# Patient Record
Sex: Female | Born: 1990
Health system: Southern US, Community
[De-identification: ages and names within clinical notes are randomized; demographics above are authoritative.]

## PROBLEM LIST (undated history)

## (undated) DIAGNOSIS — Z789 Other specified health status: Secondary | ICD-10-CM

## (undated) HISTORY — PX: REFRACTIVE SURGERY: SHX103

## (undated) HISTORY — PX: EYE SURGERY: SHX253

---
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
## 2009-01-15 ENCOUNTER — Encounter: Payer: Self-pay | Admitting: Internal Medicine

## 2009-01-18 ENCOUNTER — Encounter: Payer: Self-pay | Admitting: Internal Medicine

## 2009-03-10 ENCOUNTER — Ambulatory Visit: Payer: Self-pay | Admitting: Internal Medicine

## 2009-03-10 DIAGNOSIS — R634 Abnormal weight loss: Secondary | ICD-10-CM | POA: Insufficient documentation

## 2009-03-10 DIAGNOSIS — R197 Diarrhea, unspecified: Secondary | ICD-10-CM | POA: Insufficient documentation

## 2009-03-10 LAB — CONVERTED CEMR LAB: IgA: 197 mg/dL (ref 68–378)

## 2009-03-12 LAB — CONVERTED CEMR LAB: Tissue Transglutaminase Ab, IgA: 0.1 units (ref <7)

## 2015-08-02 ENCOUNTER — Ambulatory Visit: Payer: Self-pay | Admitting: Physician Assistant

## 2015-08-02 ENCOUNTER — Encounter: Payer: Self-pay | Admitting: Physician Assistant

## 2015-08-02 VITALS — BP 120/80 | Temp 98.4°F | Wt 125.0 lb

## 2015-08-02 DIAGNOSIS — L301 Dyshidrosis [pompholyx]: Secondary | ICD-10-CM

## 2015-08-02 NOTE — Progress Notes (Signed)
  Rash on finger Subjective:    Patient ID: Renee Hart, female    DOB: Oct 15, 1991, 24 y.o.   MRN: [id number]  HPI Patient c/o rash on 2nd digit right hand. Patient states contact with cleaning chemicals at work without using gloves.  States mild itching. Condition is improving with 1% hydrocortisone.    Review of Systems Diarrhea     Objective:   Physical Exam Papular lesion on non-erythematous 2nd digit right hand.       Assessment & Plan:  Dyshidrotic eczema.  Advised to continue using Hydrocortisone ointment.  Follow up if condition worsen.

## 2016-05-19 ENCOUNTER — Ambulatory Visit: Payer: Self-pay | Admitting: Family

## 2016-05-19 VITALS — BP 110/70 | HR 117 | Temp 98.0°F

## 2016-05-19 DIAGNOSIS — H109 Unspecified conjunctivitis: Secondary | ICD-10-CM

## 2016-05-22 MED ORDER — CIPROFLOXACIN HCL 0.3 % OP SOLN: 2.0000 [drp] | Freq: Four times a day (QID) | OPHTHALMIC | Status: AC

## 2016-05-22 NOTE — Progress Notes (Signed)
S/ eyes are very red, irritated, left eye watering slight light sensitivity,some mattering in the am, hx of allergies but no acute sxs , and lasik surgery .She googled and thought she might have a blocked tear duct and has been massaging her eye. Also concerned that she has been going to tanning bed that may have been contraindicated with lasik  O pleasant NAD  PERRLA, EOMS full, both conjunctiva are injected, no obvious inner canthus swelling or redness ,nasal mucosa swollen, sinuses nontender , throat clear  A/ conjunctivitis P/ ciprfloxin opthalmic 0.3 % 1-2 gtts each eye qid x 5 days  F/u with eye dr if sxs not improving.

## 2016-10-04 ENCOUNTER — Encounter: Payer: Self-pay | Admitting: Physician Assistant

## 2016-10-04 ENCOUNTER — Ambulatory Visit: Payer: Self-pay | Admitting: Physician Assistant

## 2016-10-04 VITALS — BP 120/80 | HR 80 | Temp 98.3°F

## 2016-10-04 DIAGNOSIS — J00 Acute nasopharyngitis [common cold]: Secondary | ICD-10-CM

## 2016-10-04 NOTE — Progress Notes (Signed)
S: C/o sore throat and dry cough 2 days, no fever, chills, cp/sob, v/d; mucus was green this am but clear throughout the day, cough is sporadic,   Using otc meds:   O: PE: perrl eomi, normocephalic, tms dull, nasal mucosa red and swollen, throat injected, neck supple no lymph, lungs c t a, cv rrr, neuro intact  A:  Acute viral uri   P: drink fluids, continue regular meds , use otc meds of choice, return if not improving in 5 days, return earlier if worsening ,reassurance,

## 2017-11-27 DIAGNOSIS — D2239 Melanocytic nevi of other parts of face: Secondary | ICD-10-CM | POA: Diagnosis not present

## 2017-11-27 DIAGNOSIS — L700 Acne vulgaris: Secondary | ICD-10-CM | POA: Diagnosis not present

## 2017-11-27 DIAGNOSIS — D485 Neoplasm of uncertain behavior of skin: Secondary | ICD-10-CM | POA: Diagnosis not present

## 2018-02-12 DIAGNOSIS — D2239 Melanocytic nevi of other parts of face: Secondary | ICD-10-CM | POA: Diagnosis not present

## 2018-02-12 DIAGNOSIS — L700 Acne vulgaris: Secondary | ICD-10-CM | POA: Diagnosis not present

## 2018-02-12 DIAGNOSIS — D485 Neoplasm of uncertain behavior of skin: Secondary | ICD-10-CM | POA: Diagnosis not present

## 2018-03-26 ENCOUNTER — Ambulatory Visit (INDEPENDENT_AMBULATORY_CARE_PROVIDER_SITE_OTHER): Payer: 59 | Admitting: Obstetrics and Gynecology

## 2018-03-26 ENCOUNTER — Encounter: Payer: Self-pay | Admitting: Obstetrics and Gynecology

## 2018-03-26 VITALS — BP 118/78 | HR 79 | Ht 64.0 in | Wt 124.2 lb

## 2018-03-26 DIAGNOSIS — Z124 Encounter for screening for malignant neoplasm of cervix: Secondary | ICD-10-CM

## 2018-03-26 DIAGNOSIS — Z8041 Family history of malignant neoplasm of ovary: Secondary | ICD-10-CM

## 2018-03-26 DIAGNOSIS — Z01419 Encounter for gynecological examination (general) (routine) without abnormal findings: Secondary | ICD-10-CM

## 2018-03-26 DIAGNOSIS — K219 Gastro-esophageal reflux disease without esophagitis: Secondary | ICD-10-CM | POA: Diagnosis not present

## 2018-03-26 NOTE — Progress Notes (Signed)
Pt is present today for annual exam. Pt stated that this is her first woman's exam. Pt stated that she is doing well.

## 2018-03-26 NOTE — Patient Instructions (Signed)
Health Maintenance, Female Adopting a healthy lifestyle and getting preventive care can go a long way to promote health and wellness. Talk with your health care provider about what schedule of regular examinations is right for you. This is a good chance for you to check in with your provider about disease prevention and staying healthy. In between checkups, there are plenty of things you can do on your own. Experts have done a lot of research about which lifestyle changes and preventive measures are most likely to keep you healthy. Ask your health care provider for more information. Weight and diet Eat a healthy diet  Be sure to include plenty of vegetables, fruits, low-fat dairy products, and lean protein.  Do not eat a lot of foods high in solid fats, added sugars, or salt.  Get regular exercise. This is one of the most important things you can do for your health. ? Most adults should exercise for at least 150 minutes each week. The exercise should increase your heart rate and make you sweat (moderate-intensity exercise). ? Most adults should also do strengthening exercises at least twice a week. This is in addition to the moderate-intensity exercise.  Maintain a healthy weight  Body mass index (BMI) is a measurement that can be used to identify possible weight problems. It estimates body fat based on height and weight. Your health care provider can help determine your BMI and help you achieve or maintain a healthy weight.  For females 28 years of age and older: ? A BMI below 18.5 is considered underweight. ? A BMI of 18.5 to 24.9 is normal. ? A BMI of 25 to 29.9 is considered overweight. ? A BMI of 30 and above is considered obese.  Watch levels of cholesterol and blood lipids  You should start having your blood tested for lipids and cholesterol at 28 years of age, then have this test every 5 years.  You may need to have your cholesterol levels checked more often if: ? Your lipid or  cholesterol levels are high. ? You are older than 28 years of age. ? You are at high risk for heart disease.  Cancer screening Lung Cancer  Lung cancer screening is recommended for adults 28-72 years old who are at high risk for lung cancer because of a history of smoking.  A yearly low-dose CT scan of the lungs is recommended for people who: ? Currently smoke. ? Have quit within the past 15 years. ? Have at least a 30-pack-year history of smoking. A pack year is smoking an average of one pack of cigarettes a day for 1 year.  Yearly screening should continue until it has been 15 years since you quit.  Yearly screening should stop if you develop a health problem that would prevent you from having lung cancer treatment.  Breast Cancer  Practice breast self-awareness. This means understanding how your breasts normally appear and feel.  It also means doing regular breast self-exams. Let your health care provider know about any changes, no matter how small.  If you are in your 20s or 30s, you should have a clinical breast exam (CBE) by a health care provider every 1-3 years as part of a regular health exam.  If you are 64 or older, have a CBE every year. Also consider having a breast X-ray (mammogram) every year.  If you have a family history of breast cancer, talk to your health care provider about genetic screening.  If you are at high risk  for breast cancer, talk to your health care provider about having an MRI and a mammogram every year.  Breast cancer gene (BRCA) assessment is recommended for women who have family members with BRCA-related cancers. BRCA-related cancers include: ? Breast. ? Ovarian. ? Tubal. ? Peritoneal cancers.  Results of the assessment will determine the need for genetic counseling and BRCA1 and BRCA2 testing.  Cervical Cancer Your health care provider may recommend that you be screened regularly for cancer of the pelvic organs (ovaries, uterus, and  vagina). This screening involves a pelvic examination, including checking for microscopic changes to the surface of your cervix (Pap test). You may be encouraged to have this screening done every 3 years, beginning at age 22.  For women ages 56-65, health care providers may recommend pelvic exams and Pap testing every 3 years, or they may recommend the Pap and pelvic exam, combined with testing for human papilloma virus (HPV), every 5 years. Some types of HPV increase your risk of cervical cancer. Testing for HPV may also be done on women of any age with unclear Pap test results.  Other health care providers may not recommend any screening for nonpregnant women who are considered low risk for pelvic cancer and who do not have symptoms. Ask your health care provider if a screening pelvic exam is right for you.  If you have had past treatment for cervical cancer or a condition that could lead to cancer, you need Pap tests and screening for cancer for at least 20 years after your treatment. If Pap tests have been discontinued, your risk factors (such as having a new sexual partner) need to be reassessed to determine if screening should resume. Some women have medical problems that increase the chance of getting cervical cancer. In these cases, your health care provider may recommend more frequent screening and Pap tests.  Colorectal Cancer  This type of cancer can be detected and often prevented.  Routine colorectal cancer screening usually begins at 28 years of age and continues through 28 years of age.  Your health care provider may recommend screening at an earlier age if you have risk factors for colon cancer.  Your health care provider may also recommend using home test kits to check for hidden blood in the stool.  A small camera at the end of a tube can be used to examine your colon directly (sigmoidoscopy or colonoscopy). This is done to check for the earliest forms of colorectal  cancer.  Routine screening usually begins at age 33.  Direct examination of the colon should be repeated every 5-10 years through 28 years of age. However, you may need to be screened more often if early forms of precancerous polyps or small growths are found.  Skin Cancer  Check your skin from head to toe regularly.  Tell your health care provider about any new moles or changes in moles, especially if there is a change in a mole's shape or color.  Also tell your health care provider if you have a mole that is larger than the size of a pencil eraser.  Always use sunscreen. Apply sunscreen liberally and repeatedly throughout the day.  Protect yourself by wearing long sleeves, pants, a wide-brimmed hat, and sunglasses whenever you are outside.  Heart disease, diabetes, and high blood pressure  High blood pressure causes heart disease and increases the risk of stroke. High blood pressure is more likely to develop in: ? People who have blood pressure in the high end of  the normal range (130-139/85-89 mm Hg). ? People who are overweight or obese. ? People who are African American.  If you are 60-32 years of age, have your blood pressure checked every 3-5 years. If you are 56 years of age or older, have your blood pressure checked every year. You should have your blood pressure measured twice-once when you are at a hospital or clinic, and once when you are not at a hospital or clinic. Record the average of the two measurements. To check your blood pressure when you are not at a hospital or clinic, you can use: ? An automated blood pressure machine at a pharmacy. ? A home blood pressure monitor.  If you are between 26 years and 88 years old, ask your health care provider if you should take aspirin to prevent strokes.  Have regular diabetes screenings. This involves taking a blood sample to check your fasting blood sugar level. ? If you are at a normal weight and have a low risk for diabetes,  have this test once every three years after 28 years of age. ? If you are overweight and have a high risk for diabetes, consider being tested at a younger age or more often. Preventing infection Hepatitis B  If you have a higher risk for hepatitis B, you should be screened for this virus. You are considered at high risk for hepatitis B if: ? You were born in a country where hepatitis B is common. Ask your health care provider which countries are considered high risk. ? Your parents were born in a high-risk country, and you have not been immunized against hepatitis B (hepatitis B vaccine). ? You have HIV or AIDS. ? You use needles to inject street drugs. ? You live with someone who has hepatitis B. ? You have had sex with someone who has hepatitis B. ? You get hemodialysis treatment. ? You take certain medicines for conditions, including cancer, organ transplantation, and autoimmune conditions.  Hepatitis C  Blood testing is recommended for: ? Everyone born from [birth year] through 1966. ? Anyone with known risk factors for hepatitis C.  Sexually transmitted infections (STIs)  You should be screened for sexually transmitted infections (STIs) including gonorrhea and chlamydia if: ? You are sexually active and are younger than 28 years of age. ? You are older than 28 years of age and your health care provider tells you that you are at risk for this type of infection. ? Your sexual activity has changed since you were last screened and you are at an increased risk for chlamydia or gonorrhea. Ask your health care provider if you are at risk.  If you do not have HIV, but are at risk, it may be recommended that you take a prescription medicine daily to prevent HIV infection. This is called pre-exposure prophylaxis (PrEP). You are considered at risk if: ? You are sexually active and do not regularly use condoms or know the HIV status of your partner(s). ? You take drugs by injection. ? You are  sexually active with a partner who has HIV.  Talk with your health care provider about whether you are at high risk of being infected with HIV. If you choose to begin PrEP, you should first be tested for HIV. You should then be tested every 3 months for as long as you are taking PrEP. Pregnancy  If you are premenopausal and you may become pregnant, ask your health care provider about preconception counseling.  If you may become  pregnant, take 400 to 800 micrograms (mcg) of folic acid every day.  If you want to prevent pregnancy, talk to your health care provider about birth control (contraception). Osteoporosis and menopause  Osteoporosis is a disease in which the bones lose minerals and strength with aging. This can result in serious bone fractures. Your risk for osteoporosis can be identified using a bone density scan.  If you are 40 years of age or older, or if you are at risk for osteoporosis and fractures, ask your health care provider if you should be screened.  Ask your health care provider whether you should take a calcium or vitamin D supplement to lower your risk for osteoporosis.  Menopause may have certain physical symptoms and risks.  Hormone replacement therapy may reduce some of these symptoms and risks. Talk to your health care provider about whether hormone replacement therapy is right for you. Follow these instructions at home:  Schedule regular health, dental, and eye exams.  Stay current with your immunizations.  Do not use any tobacco products including cigarettes, chewing tobacco, or electronic cigarettes.  If you are pregnant, do not drink alcohol.  If you are breastfeeding, limit how much and how often you drink alcohol.  Limit alcohol intake to no more than 1 drink per day for nonpregnant women. One drink equals 12 ounces of beer, 5 ounces of wine, or 1 ounces of hard liquor.  Do not use street drugs.  Do not share needles.  Ask your health care  provider for help if you need support or information about quitting drugs.  Tell your health care provider if you often feel depressed.  Tell your health care provider if you have ever been abused or do not feel safe at home. This information is not intended to replace advice given to you by your health care provider. Make sure you discuss any questions you have with your health care provider. Document Released: 04/17/2011 Document Revised: 03/09/2016 Document Reviewed: 07/06/2015 Elsevier Interactive Patient Education  2018 [employer]. HPV (Human Papillomavirus) Vaccine: What You Need to Know 1. Why get vaccinated? HPV vaccine prevents infection with human papillomavirus (HPV) types that are associated with many cancers, including:  cervical cancer in females,  vaginal and vulvar cancers in females,  anal cancer in females and males,  throat cancer in females and males, and  penile cancer in males.  In addition, HPV vaccine prevents infection with HPV types that cause genital warts in both females and males. In the [place] about 12,000 women get cervical cancer every year, and about 4,000 women die from it. HPV vaccine can prevent most of these cases of cervical cancer. Vaccination is not a substitute for cervical cancer screening. This vaccine does not protect against all HPV types that can cause cervical cancer. Women should still get regular Pap tests. HPV infection usually comes from sexual contact, and most people will become infected at some point in their life. About 14 million Americans, including teens, get infected every year. Most infections will go away on their own and not cause serious problems. But thousands of women and men get cancer and other diseases from HPV. 2. HPV vaccine HPV vaccine is approved by FDA and is recommended by CDC for both males and females. It is routinely given at 50 or 28 years of age, but it may be given beginning at age 50 years through age 64  years. Most adolescents 9 through 28 years of age should get HPV vaccine as a  two-dose series with the doses separated by 6-12 months. People who start HPV vaccination at 28 years of age and older should get the vaccine as a three-dose series with the second dose given 1-2 months after the first dose and the third dose given 6 months after the first dose. There are several exceptions to these age recommendations. Your health care provider can give you more information. 3. Some people should not get this vaccine  Anyone who has had a severe (life-threatening) allergic reaction to a dose of HPV vaccine should not get another dose.  Anyone who has a severe (life threatening) allergy to any component of HPV vaccine should not get the vaccine.  Tell your doctor if you have any severe allergies that you know of, including a severe allergy to yeast.  HPV vaccine is not recommended for pregnant women. If you learn that you were pregnant when you were vaccinated, there is no reason to expect any problems for you or your baby. Any woman who learns she was pregnant when she got HPV vaccine is encouraged to contact the manufacturer's registry for HPV vaccination during pregnancy at (732)506-0493. Women who are breastfeeding may be vaccinated.  If you have a mild illness, such as a cold, you can probably get the vaccine today. If you are moderately or severely ill, you should probably wait until you recover. Your doctor can advise you. 4. Risks of a vaccine reaction With any medicine, including vaccines, there is a chance of side effects. These are usually mild and go away on their own, but serious reactions are also possible. Most people who get HPV vaccine do not have any serious problems with it. Mild or moderate problems following HPV vaccine:  Reactions in the arm where the shot was given: ? Soreness (about 9 people in 10) ? Redness or swelling (about 1 person in 3)  Fever: ? Mild (100F) (about 1  person in 10) ? Moderate (102F) (about 1 person in 69)  Other problems: ? Headache (about 1 person in 3) Problems that could happen after any injected vaccine:  People sometimes faint after a medical procedure, including vaccination. Sitting or lying down for about 15 minutes can help prevent fainting, and injuries caused by a fall. Tell your doctor if you feel dizzy, or have vision changes or ringing in the ears.  Some people get severe pain in the shoulder and have difficulty moving the arm where a shot was given. This happens very rarely.  Any medication can cause a severe allergic reaction. Such reactions from a vaccine are very rare, estimated at about 1 in a million doses, and would happen within a few minutes to a few hours after the vaccination. As with any medicine, there is a very remote chance of a vaccine causing a serious injury or death. The safety of vaccines is always being monitored. For more information, visit: http://www.aguilar.org/. 5. What if there is a serious reaction? What should I look for? Look for anything that concerns you, such as signs of a severe allergic reaction, very high fever, or unusual behavior. Signs of a severe allergic reaction can include hives, swelling of the face and throat, difficulty breathing, a fast heartbeat, dizziness, and weakness. These would usually start a few minutes to a few hours after the vaccination. What should I do? If you think it is a severe allergic reaction or other emergency that can't wait, call 9-1-1 or get to the nearest hospital. Otherwise, call your doctor. Afterward, the  reaction should be reported to the Vaccine Adverse Event Reporting System (VAERS). Your doctor should file this report, or you can do it yourself through the VAERS web site at www.vaers.SamedayNews.es, or by calling [phone]. VAERS does not give medical advice. 6. The National Vaccine Injury Compensation Program The [employer] Vaccine Injury  Compensation Program (VICP) is a federal program that was created to compensate people who may have been injured by certain vaccines. Persons who believe they may have been injured by a vaccine can learn about the program and about filing a claim by calling 570-497-2072 or visiting the [facility] website at GoldCloset.com.ee. There is a time limit to file a claim for compensation. 7. How can I learn more?  Ask your health care provider. He or she can give you the vaccine package insert or suggest other sources of information.  Call your local or state health department.  Contact the Centers for Disease Control and Prevention (CDC): ? Call 954 673 5409 (1-800-CDC-INFO) or ? Visit CDC's website at http://sweeney-todd.com/ Vaccine Information Statement, HPV Vaccine (09/17/2015) This information is not intended to replace advice given to you by your health care provider. Make sure you discuss any questions you have with your health care provider. Document Released: 04/29/2014 Document Revised: 06/22/2016 Document Reviewed: 06/22/2016 Elsevier Interactive Patient Education  2017 [employer].

## 2018-03-26 NOTE — Progress Notes (Signed)
GYNECOLOGY ANNUAL PHYSICAL EXAM PROGRESS NOTE  Subjective:    Renee Hart is a 27 y.o. G0P0000 female who presents to establish care, and for an annual exam. Patient states that this is her first gynecologic exam. The patient has no complaints today. The patient is sexually active. The patient wears seatbelts: yes. The patient participates in regular exercise: no. Has the patient ever been transfused or tattooed?: no. The patient reports that there is not domestic violence in her life.    Gynecologic History Patient's last menstrual period was 03/23/2018. Menstrual History: OB History    Gravida  0   Para  0   Term  0   Preterm  0   AB  0   Living  0     SAB  0   TAB  0   Ectopic  0   Multiple  0   Live Births  0           Menarche age: 69 or 49 Patient's last menstrual period was 03/23/2018. Contraception: condoms History of STI's: Deneis Last Pap: the patient has never had a pap smear.   OB History  Gravida Para Term Preterm AB Living  0 0 0 0 0 0  SAB TAB Ectopic Multiple Live Births  0 0 0 0 0    History reviewed. No pertinent past medical history.  Past Surgical History:  Procedure Laterality Date  . REFRACTIVE SURGERY      Family History  Problem Relation Age of Onset  . Ovarian cancer Maternal Grandmother     Social History   Socioeconomic History  . Marital status: Single    Spouse name: Not on file  . Number of children: Not on file  . Years of education: Not on file  . Highest education level: Not on file  Occupational History  . Not on file  Social Needs  . Financial resource strain: Not on file  . Food insecurity:    Worry: Not on file    Inability: Not on file  . Transportation needs:    Medical: Not on file    Non-medical: Not on file  Tobacco Use  . Smoking status: Never Smoker  . Smokeless tobacco: Never Used  Substance and Sexual Activity  . Alcohol use: Yes    Alcohol/week: 0.0 oz    Comment: socially    . Drug use: Never  . Sexual activity: Yes    Birth control/protection: Condom  Lifestyle  . Physical activity:    Days per week: Not on file    Minutes per session: Not on file  . Stress: Not on file  Relationships  . Social connections:    Talks on phone: Not on file    Gets together: Not on file    Attends religious service: Not on file    Active member of club or organization: Not on file    Attends meetings of clubs or organizations: Not on file    Relationship status: Not on file  . Intimate partner violence:    Fear of current or ex partner: Not on file    Emotionally abused: Not on file    Physically abused: Not on file    Forced sexual activity: Not on file  Other Topics Concern  . Not on file  Social History Narrative  . Not on file    Current Outpatient Medications on File Prior to Visit  Medication Sig Dispense Refill  . omeprazole (PRILOSEC) 40 MG  capsule Take 40 mg by mouth daily.    Marland Kitchen spironolactone (ALDACTONE) 50 MG tablet Take 50 mg by mouth 2 (two) times daily.     No current facility-administered medications on file prior to visit.     No Known Allergies    Review of Systems Constitutional: negative for chills, fatigue, fevers and sweats Eyes: negative for irritation, redness and visual disturbance Ears, nose, mouth, throat, and face: negative for hearing loss, nasal congestion, snoring and tinnitus Respiratory: negative for asthma, cough, sputum Cardiovascular: negative for chest pain, dyspnea, exertional chest pressure/discomfort, irregular heart beat, palpitations and syncope Gastrointestinal: negative for abdominal pain, change in bowel habits, nausea and vomiting.  Positive for mild pain (burning sensation) after certain types of foods in epigastrum.  Genitourinary: negative for abnormal menstrual periods, genital lesions, sexual problems and vaginal discharge, dysuria and urinary incontinence Integument/breast: negative for breast lump, breast  tenderness and nipple discharge Hematologic/lymphatic: negative for bleeding and easy bruising Musculoskeletal:negative for back pain and muscle weakness Neurological: negative for dizziness, headaches, vertigo and weakness Endocrine: negative for diabetic symptoms including polydipsia, polyuria and skin dryness Allergic/Immunologic: negative for hay fever and urticaria        Objective:  Blood pressure 118/78, pulse 79, height 5\' 4"  (1.626 m), weight 124 lb 3.2 oz (56.3 kg), last menstrual period 03/23/2018. Body mass index is 21.32 kg/m.  General Appearance:    Alert, cooperative, no distress, appears stated age  Head:    Normocephalic, without obvious abnormality, atraumatic  Eyes:    PERRL, conjunctiva/corneas clear, EOM's intact, both eyes  Ears:    Normal external ear canals, both ears  Nose:   Nares normal, septum midline, mucosa normal, no drainage or sinus tenderness  Throat:   Lips, mucosa, and tongue normal; teeth and gums normal  Neck:   Supple, symmetrical, trachea midline, no adenopathy; thyroid: no enlargement/tenderness/nodules; no carotid bruit or JVD  Back:     Symmetric, no curvature, ROM normal, no CVA tenderness  Lungs:     Clear to auscultation bilaterally, respirations unlabored  Chest Wall:    No tenderness or deformity   Heart:    Regular rate and rhythm, S1 and S2 normal, no murmur, rub or gallop  Breast Exam:    No tenderness, masses, or nipple abnormality  Abdomen:     Soft, non-tender, bowel sounds active all four quadrants, no masses, no organomegaly.    Genitalia:    Pelvic:external genitalia normal, vagina without lesions, discharge, or tenderness, rectovaginal septum  normal. Cervix normal in appearance, no cervical motion tenderness, no adnexal masses or tenderness.  Uterus normal size, shape, mobile, regular contours, nontender.  Rectal:    Normal external sphincter.  No hemorrhoids appreciated. Internal exam not done.   Extremities:   Extremities  normal, atraumatic, no cyanosis or edema  Pulses:   2+ and symmetric all extremities  Skin:   Skin color, texture, turgor normal, no rashes or lesions  Lymph nodes:   Cervical, supraclavicular, and axillary nodes normal  Neurologic:   CNII-XII intact, normal strength, sensation and reflexes throughout    Labs:  No results found for: WBC, HGB, HCT, MCV, PLT  No results found for: CREATININE, BUN, NA, K, CL, CO2  No results found for: ALT, AST, GGT, ALKPHOS, BILITOT  No results found for: TSH   Assessment:    Healthy female exam.    Family h/o ovarian cancer  Gastroesophageal reflux   Plan:     Blood tests: CBC with diff and  Comprehensive metabolic panel. Breast self exam technique reviewed and patient encouraged to perform self-exam monthly. Contraception: condoms. Discussed healthy lifestyle modifications. Pap smear performed today, with GC/NT.   Family h/o ovarian cancer in grandmother, patient unsure exactly what age, however does think that she was older when diagnosed. Will continue to screen annually with bimanual exam. If patient with another significant cancer in the family, should consider hereditary cancer screening.  Advised on trying Tums or Zantac OTC for possible reflux symptoms, avoiding acidic, spicy, and greasy foods.  Follow up in 1 year.     Rubie Maid, MD [facility]

## 2018-03-27 LAB — COMPREHENSIVE METABOLIC PANEL
ALT: 8 IU/L (ref 0–32)
AST: 14 IU/L (ref 0–40)
Albumin/Globulin Ratio: 2.1 (ref 1.2–2.2)
Albumin: 5 g/dL (ref 3.5–5.5)
Alkaline Phosphatase: 54 IU/L (ref 39–117)
BUN/Creatinine Ratio: 15 (ref 9–23)
BUN: 11 mg/dL (ref 6–20)
Bilirubin Total: 0.5 mg/dL (ref 0.0–1.2)
CO2: 24 mmol/L (ref 20–29)
Calcium: 9.6 mg/dL (ref 8.7–10.2)
Chloride: 102 mmol/L (ref 96–106)
Creatinine, Ser: 0.73 mg/dL (ref 0.57–1.00)
GFR calc Af Amer: 130 mL/min/{1.73_m2} (ref >59)
GFR calc non Af Amer: 112 mL/min/{1.73_m2} (ref >59)
Globulin, Total: 2.4 g/dL (ref 1.5–4.5)
Glucose: 105 mg/dL — ABNORMAL HIGH (ref 65–99)
Potassium: 3.9 mmol/L (ref 3.5–5.2)
Sodium: 141 mmol/L (ref 134–144)
Total Protein: 7.4 g/dL (ref 6.0–8.5)

## 2018-03-27 LAB — CBC
Hematocrit: 41.7 % (ref 34.0–46.6)
Hemoglobin: 14 g/dL (ref 11.1–15.9)
MCH: 31.7 pg (ref 26.6–33.0)
MCHC: 33.6 g/dL (ref 31.5–35.7)
MCV: 95 fL (ref 79–97)
Platelets: 226 10*3/uL (ref 150–450)
RBC: 4.41 x10E6/uL (ref 3.77–5.28)
RDW: 12.9 % (ref 12.3–15.4)
WBC: 4.1 10*3/uL (ref 3.4–10.8)

## 2018-03-30 DIAGNOSIS — K219 Gastro-esophageal reflux disease without esophagitis: Secondary | ICD-10-CM | POA: Insufficient documentation

## 2018-03-31 LAB — PAP IG, CT-NG, RFX HPV ASCU
Chlamydia, Nuc. Acid Amp: NEGATIVE
Gonococcus by Nucleic Acid Amp: NEGATIVE
PAP Smear Comment: 0

## 2018-04-09 ENCOUNTER — Encounter: Payer: Self-pay | Admitting: Obstetrics and Gynecology

## 2018-05-31 DIAGNOSIS — L700 Acne vulgaris: Secondary | ICD-10-CM | POA: Diagnosis not present

## 2018-07-26 DIAGNOSIS — S83281A Other tear of lateral meniscus, current injury, right knee, initial encounter: Secondary | ICD-10-CM | POA: Diagnosis not present

## 2018-08-07 ENCOUNTER — Other Ambulatory Visit: Payer: Self-pay | Admitting: Orthopedic Surgery

## 2018-08-20 ENCOUNTER — Other Ambulatory Visit: Payer: Self-pay

## 2018-08-20 ENCOUNTER — Encounter
Admission: RE | Admit: 2018-08-20 | Discharge: 2018-08-20 | Disposition: A | Discharge status: Home / Self Care | Payer: 59 | Source: Ambulatory Visit | Attending: Orthopedic Surgery | Admitting: Orthopedic Surgery

## 2018-08-20 HISTORY — DX: Other specified health status: Z78.9

## 2018-08-20 NOTE — Patient Instructions (Signed)
Your procedure is scheduled on: 08/29/18 Report to Day Surgery. [facility]OND [facility]LO[facility] To find out your arrival time please call [phone] between 1PM - 3PM on 08/28/18  Remember: Instructions that are not followed completely may result in serious medical risk,  up to and including death, or upon the discretion of your surgeon and anesthesiologist your  surgery may need to be rescheduled.     _X__ 1. Do not eat food after midnight the night before your procedure.                 No gum chewing or hard candies. You may drink clear liquids up to 2 hours                 before you are scheduled to arrive for your surgery- DO not drink clear                 liquids within 2 hours of the start of your surgery.                 Clear Liquids include:  water, apple juice without pulp, clear carbohydrate                 drink such as Clearfast of Gatorade, Black Coffee or Tea (Do not add                 anything to coffee or tea).  __X__2.  On the morning of surgery brush your teeth with toothpaste and water, you                may rinse your mouth with mouthwash if you wish.  Do not swallow any toothpaste of mouthwash.     _X__ 3.  No Alcohol for 24 hours before or after surgery.   _X__ 4.  Do Not Smoke or use e-cigarettes For 24 Hours Prior to Your Surgery.                 Do not use any chewable tobacco products for at least 6 hours prior to                 surgery.  ____  5.  Bring all medications with you on the day of surgery if instructed.   X__  6.  Notify your doctor if there is any change in your medical condition      (cold, fever, infections).     Do not wear jewelry, make-up, hairpins, clips or nail polish. Do not wear lotions, powders, or perfumes. You may wear deodorant. Do not shave 48 hours prior to surgery. Men may shave face and neck. Do not bring valuables to the hospital.    [facility] is not responsible for any belongings or  valuables.  Contacts, dentures or bridgework may not be worn into surgery. Leave your suitcase in the car. After surgery it may be brought to your room. For patients admitted to the hospital, discharge time is determined by your treatment team.   Patients discharged the day of surgery will not be allowed to drive home.   Please read over the following fact sheets that you were given:   Surgical Site Infection Prevention          _X___ Take these medicines the morning of surgery with A SIP OF WATER:    1. NONE  2.   3.   4.  5.  6.  ____ Fleet Enema (as directed)  __X__ Use CHG Soap as directed  ____ Use inhalers on the day of surgery  ____ Stop metformin 2 days prior to surgery    ____ Take 1/2 of usual insulin dose the night before surgery. No insulin the morning          of surgery.   ____ Stop Coumadin/Plavix/aspirin on   ____ Stop Anti-inflammatories on   ____ Stop supplements until after surgery.    ____ Bring C-Pap to the hospital.

## 2018-08-21 ENCOUNTER — Encounter
Admission: RE | Admit: 2018-08-21 | Discharge: 2018-08-21 | Disposition: A | Discharge status: Home / Self Care | Payer: 59 | Source: Ambulatory Visit | Attending: Orthopedic Surgery | Admitting: Orthopedic Surgery

## 2018-08-21 DIAGNOSIS — Z01812 Encounter for preprocedural laboratory examination: Secondary | ICD-10-CM | POA: Diagnosis not present

## 2018-08-21 LAB — CBC WITH DIFFERENTIAL/PLATELET
Abs Immature Granulocytes: 0.01 10*3/uL (ref 0.00–0.07)
Basophils Absolute: 0 10*3/uL (ref 0.0–0.1)
Basophils Relative: 1 %
Eosinophils Absolute: 0.1 10*3/uL (ref 0.0–0.5)
Eosinophils Relative: 2 %
HCT: 40.5 % (ref 36.0–46.0)
Hemoglobin: 13.2 g/dL (ref 12.0–15.0)
Immature Granulocytes: 0 %
Lymphocytes Relative: 26 %
Lymphs Abs: 1.4 10*3/uL (ref 0.7–4.0)
MCH: 32.1 pg (ref 26.0–34.0)
MCHC: 32.6 g/dL (ref 30.0–36.0)
MCV: 98.5 fL (ref 80.0–100.0)
Monocytes Absolute: 0.4 10*3/uL (ref 0.1–1.0)
Monocytes Relative: 8 %
Neutro Abs: 3.4 10*3/uL (ref 1.7–7.7)
Neutrophils Relative %: 63 %
Platelets: 236 10*3/uL (ref 150–400)
RBC: 4.11 MIL/uL (ref 3.87–5.11)
RDW: 11.6 % (ref 11.5–15.5)
WBC: 5.3 10*3/uL (ref 4.0–10.5)
nRBC: 0 % (ref 0.0–0.2)

## 2018-08-21 LAB — BASIC METABOLIC PANEL
Anion gap: 7 (ref 5–15)
BUN: 10 mg/dL (ref 6–20)
CO2: 28 mmol/L (ref 22–32)
Calcium: 9.4 mg/dL (ref 8.9–10.3)
Chloride: 103 mmol/L (ref 98–111)
Creatinine, Ser: 0.64 mg/dL (ref 0.44–1.00)
GFR calc Af Amer: >60 mL/min (ref >60)
GFR calc non Af Amer: >60 mL/min (ref >60)
Glucose, Bld: 98 mg/dL (ref 70–99)
Potassium: 3.6 mmol/L (ref 3.5–5.1)
Sodium: 138 mmol/L (ref 135–145)

## 2018-08-21 LAB — PROTIME-INR
INR: 1.02
Prothrombin Time: 13.3 seconds (ref 11.4–15.2)

## 2018-08-21 LAB — APTT: aPTT: 34 seconds (ref 24–36)

## 2018-08-27 MED ORDER — CEFAZOLIN SODIUM-DEXTROSE 2-4 GM/100ML-% IV SOLN
2.0000 g | INTRAVENOUS | Status: AC
  Administered 2018-08-28: 2 g via INTRAVENOUS

## 2018-08-28 ENCOUNTER — Ambulatory Visit
Admission: RE | Admit: 2018-08-28 | Discharge: 2018-08-28 | Disposition: A | Discharge status: Home / Self Care | Payer: 59 | Source: Ambulatory Visit | Attending: Orthopedic Surgery | Admitting: Orthopedic Surgery

## 2018-08-28 ENCOUNTER — Ambulatory Visit: Payer: 59 | Admitting: Anesthesiology

## 2018-08-28 ENCOUNTER — Other Ambulatory Visit: Payer: Self-pay

## 2018-08-28 ENCOUNTER — Encounter
Admission: RE | Disposition: A | Discharge status: Home / Self Care | Payer: Self-pay | Source: Ambulatory Visit | Attending: Orthopedic Surgery

## 2018-08-28 DIAGNOSIS — X58XXXA Exposure to other specified factors, initial encounter: Secondary | ICD-10-CM | POA: Insufficient documentation

## 2018-08-28 DIAGNOSIS — K219 Gastro-esophageal reflux disease without esophagitis: Secondary | ICD-10-CM | POA: Diagnosis not present

## 2018-08-28 DIAGNOSIS — Z79899 Other long term (current) drug therapy: Secondary | ICD-10-CM | POA: Insufficient documentation

## 2018-08-28 DIAGNOSIS — Q686 Discoid meniscus: Secondary | ICD-10-CM | POA: Diagnosis not present

## 2018-08-28 DIAGNOSIS — S83261A Peripheral tear of lateral meniscus, current injury, right knee, initial encounter: Secondary | ICD-10-CM | POA: Diagnosis not present

## 2018-08-28 DIAGNOSIS — S83281A Other tear of lateral meniscus, current injury, right knee, initial encounter: Secondary | ICD-10-CM | POA: Diagnosis not present

## 2018-08-28 HISTORY — PX: KNEE ARTHROSCOPY WITH MEDIAL MENISECTOMY: SHX5651

## 2018-08-28 LAB — POCT PREGNANCY, URINE: PREG TEST UR: NEGATIVE

## 2018-08-28 SURGERY — ARTHROSCOPY, KNEE, WITH MEDIAL MENISCECTOMY
Anesthesia: General | Site: Knee | Laterality: Right

## 2018-08-28 MED ORDER — ONDANSETRON HCL 4 MG/2ML IJ SOLN
INTRAMUSCULAR | Status: DC | PRN
  Administered 2018-08-28: 4 mg via INTRAVENOUS

## 2018-08-28 MED ORDER — PROPOFOL 10 MG/ML IV BOLUS
INTRAVENOUS | Status: DC | PRN
  Administered 2018-08-28: 120 mg via INTRAVENOUS
  Administered 2018-08-28: 30 mg via INTRAVENOUS

## 2018-08-28 MED ORDER — FAMOTIDINE 20 MG PO TABS
20.0000 mg | ORAL_TABLET | Freq: Once | ORAL | Status: AC
  Administered 2018-08-28: 20 mg via ORAL

## 2018-08-28 MED ORDER — FAMOTIDINE 20 MG PO TABS
ORAL_TABLET | ORAL | Status: AC
  Administered 2018-08-28: 20 mg via ORAL
  Filled 2018-08-28: qty 1

## 2018-08-28 MED ORDER — FENTANYL CITRATE (PF) 100 MCG/2ML IJ SOLN
INTRAMUSCULAR | Status: AC
  Filled 2018-08-28: qty 2

## 2018-08-28 MED ORDER — MIDAZOLAM HCL 2 MG/2ML IJ SOLN
INTRAMUSCULAR | Status: DC | PRN
  Administered 2018-08-28: 2 mg via INTRAVENOUS

## 2018-08-28 MED ORDER — FENTANYL CITRATE (PF) 100 MCG/2ML IJ SOLN
INTRAMUSCULAR | Status: DC | PRN
  Administered 2018-08-28 (×2): 50 ug via INTRAVENOUS

## 2018-08-28 MED ORDER — DEXAMETHASONE SODIUM PHOSPHATE 10 MG/ML IJ SOLN
INTRAMUSCULAR | Status: DC | PRN
  Administered 2018-08-28: 4 mg via INTRAVENOUS

## 2018-08-28 MED ORDER — PROMETHAZINE HCL 6.25 MG/5ML PO SYRP: 12.5000 mg | ORAL_SOLUTION | Freq: Four times a day (QID) | ORAL | 1 refills | Status: DC | PRN

## 2018-08-28 MED ORDER — ASPIRIN 81 MG PO CHEW: 324.0000 mg | CHEWABLE_TABLET | Freq: Every day | ORAL | 0 refills | Status: AC

## 2018-08-28 MED ORDER — BUPIVACAINE HCL (PF) 0.25 % IJ SOLN
INTRAMUSCULAR | Status: DC | PRN
  Administered 2018-08-28: 20 mL

## 2018-08-28 MED ORDER — BUPIVACAINE HCL (PF) 0.25 % IJ SOLN
INTRAMUSCULAR | Status: AC
  Filled 2018-08-28: qty 30

## 2018-08-28 MED ORDER — LACTATED RINGERS IV SOLN
INTRAVENOUS | Status: DC
  Administered 2018-08-28: 12:00:00 via INTRAVENOUS

## 2018-08-28 MED ORDER — FENTANYL CITRATE (PF) 100 MCG/2ML IJ SOLN
25.0000 ug | INTRAMUSCULAR | Status: DC | PRN
  Administered 2018-08-28: 25 ug via INTRAVENOUS

## 2018-08-28 MED ORDER — ONDANSETRON HCL 4 MG/2ML IJ SOLN
INTRAMUSCULAR | Status: AC
  Filled 2018-08-28: qty 2

## 2018-08-28 MED ORDER — PROPOFOL 10 MG/ML IV BOLUS
INTRAVENOUS | Status: AC
  Filled 2018-08-28: qty 20

## 2018-08-28 MED ORDER — HYDROCODONE-ACETAMINOPHEN 7.5-325 MG/15ML PO SOLN: 10.0000 mL | ORAL | 0 refills | Status: AC | PRN

## 2018-08-28 MED ORDER — ACETAMINOPHEN 10 MG/ML IV SOLN
INTRAVENOUS | Status: AC
  Filled 2018-08-28: qty 100

## 2018-08-28 MED ORDER — CHLORHEXIDINE GLUCONATE CLOTH 2 % EX PADS: 6.0000 | MEDICATED_PAD | Freq: Once | CUTANEOUS | Status: DC

## 2018-08-28 MED ORDER — CEFAZOLIN SODIUM-DEXTROSE 2-4 GM/100ML-% IV SOLN
INTRAVENOUS | Status: AC
  Filled 2018-08-28: qty 100

## 2018-08-28 MED ORDER — DEXAMETHASONE SODIUM PHOSPHATE 10 MG/ML IJ SOLN
INTRAMUSCULAR | Status: AC
  Filled 2018-08-28: qty 1

## 2018-08-28 MED ORDER — LIDOCAINE HCL (CARDIAC) PF 100 MG/5ML IV SOSY
PREFILLED_SYRINGE | INTRAVENOUS | Status: DC | PRN
  Administered 2018-08-28: 50 mg via INTRAVENOUS

## 2018-08-28 MED ORDER — ONDANSETRON HCL 4 MG/2ML IJ SOLN: 4.0000 mg | Freq: Once | INTRAMUSCULAR | Status: DC | PRN

## 2018-08-28 MED ORDER — LIDOCAINE HCL (PF) 2 % IJ SOLN
INTRAMUSCULAR | Status: AC
  Filled 2018-08-28: qty 10

## 2018-08-28 MED ORDER — ACETAMINOPHEN 10 MG/ML IV SOLN
INTRAVENOUS | Status: DC | PRN
  Administered 2018-08-28: 1000 mg via INTRAVENOUS

## 2018-08-28 MED ORDER — MIDAZOLAM HCL 2 MG/2ML IJ SOLN
INTRAMUSCULAR | Status: AC
  Filled 2018-08-28: qty 2

## 2018-08-28 SURGICAL SUPPLY — 44 items
BLADE SURG SZ11 CARB STEEL (BLADE) ×1 IMPLANT
BRACE KNEE POST OP SHORT (BRACE) ×1 IMPLANT
BUR RADIUS 3.5 (BURR) ×1 IMPLANT
BUR RADIUS 4.0X18.5 (BURR) ×1 IMPLANT
COVER WAND RF STERILE (DRAPES) ×2 IMPLANT
CUFF TOURN 24 STER (MISCELLANEOUS) IMPLANT
CUFF TOURN 30 STER DUAL PORT (MISCELLANEOUS) IMPLANT
DRAPE IMP U-DRAPE 54X76 (DRAPES) ×2 IMPLANT
DURAPREP 26ML APPLICATOR (WOUND CARE) ×6 IMPLANT
FASTFIX NDL DEL SYS 360 CVD (Miscellaneous) ×6 IMPLANT
FRR STEALTH ×1 IMPLANT
GAUZE PETRO XEROFOAM 1X8 (MISCELLANEOUS) ×2 IMPLANT
GAUZE SPONGE 4X4 12PLY STRL (GAUZE/BANDAGES/DRESSINGS) ×2 IMPLANT
GLOVE BIOGEL PI IND STRL 9 (GLOVE) ×1 IMPLANT
GLOVE BIOGEL PI INDICATOR 9 (GLOVE) ×1
GLOVE SURG 9.0 ORTHO LTXF (GLOVE) ×4 IMPLANT
GOWN STRL REUS W/ TWL LRG LVL3 (GOWN DISPOSABLE) ×1 IMPLANT
GOWN STRL REUS W/TWL 2XL LVL3 (GOWN DISPOSABLE) ×2 IMPLANT
GOWN STRL REUS W/TWL LRG LVL3 (GOWN DISPOSABLE) ×2
IV LACTATED RINGER IRRG 3000ML (IV SOLUTION) ×14
IV LR IRRIG 3000ML ARTHROMATIC (IV SOLUTION) ×6 IMPLANT
KIT TURNOVER KIT A (KITS) ×2 IMPLANT
MANIFOLD NEPTUNE II (INSTRUMENTS) ×2 IMPLANT
MAT ABSORB  FLUID 56X50 GRAY (MISCELLANEOUS) ×1
MAT ABSORB FLUID 56X50 GRAY (MISCELLANEOUS) ×1 IMPLANT
NEEDLE HYPO 22GX1.5 SAFETY (NEEDLE) ×2 IMPLANT
PACK ARTHROSCOPY KNEE (MISCELLANEOUS) ×2 IMPLANT
PAD ABD DERMACEA PRESS 5X9 (GAUZE/BANDAGES/DRESSINGS) ×4 IMPLANT
PAD WRAPON POLAR KNEE (MISCELLANEOUS) IMPLANT
PROBE BIPOLAR 50 DEGREE SUCT (MISCELLANEOUS) IMPLANT
PUSHER KNOT ARTHRO STRT FASTFI (MISCELLANEOUS) ×1 IMPLANT
SET TUBE SUCT SHAVER OUTFL 24K (TUBING) ×2 IMPLANT
SHAVER 4.2 MM LANZA 9391A (BLADE) ×1 IMPLANT
SOL PREP PVP 2OZ (MISCELLANEOUS) ×2
SOLUTION PREP PVP 2OZ (MISCELLANEOUS) ×1 IMPLANT
STRIP CLOSURE SKIN 1/2X4 (GAUZE/BANDAGES/DRESSINGS) ×2 IMPLANT
SUT ETHILON 4-0 (SUTURE) ×2
SUT ETHILON 4-0 FS2 18XMFL BLK (SUTURE) ×1
SUTURE ETHLN 4-0 FS2 18XMF BLK (SUTURE) ×1 IMPLANT
SYSTEM NDL DEL FSTFX  360 CVD (Miscellaneous) IMPLANT
TUBING ARTHRO INFLOW-ONLY STRL (TUBING) ×2 IMPLANT
WAND HAND CNTRL MULTIVAC 50 (MISCELLANEOUS) ×1 IMPLANT
WAND HAND CNTRL MULTIVAC 90 (MISCELLANEOUS) ×1 IMPLANT
WRAPON POLAR PAD KNEE (MISCELLANEOUS) ×2

## 2018-08-28 NOTE — Anesthesia Postprocedure Evaluation (Signed)
Anesthesia Post Note  Patient: Renee Hart  Procedure(s) Performed: KNEE ARTHROSCOPY WITH MEDIAL OR LATERAL MENISECTOMY (Right Knee)  Patient location during evaluation: PACU Anesthesia Type: General Level of consciousness: awake and alert Pain management: pain level controlled Vital Signs Assessment: post-procedure vital signs reviewed and stable Respiratory status: spontaneous breathing and respiratory function stable Cardiovascular status: stable Anesthetic complications: no     Last Vitals:  Vitals:   08/28/18 1617 08/28/18 1632  BP: 107/83 114/82  Pulse: 88 83  Resp: 20 17  Temp:    SpO2: 97% 99%    Last Pain:  Vitals:   08/28/18 1632  TempSrc:   PainSc: 0-No pain                 [name] K

## 2018-08-28 NOTE — Discharge Instructions (Signed)

## 2018-08-28 NOTE — Op Note (Signed)
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 femalewith a diagnosis of DISPLACED BUCKET [place] who failed conservative management and elected for surgical management.    The risks benefits and alternatives were discussed with the patient preoperatively including the risks of infection, bleeding, nerve injury, knee stiffness, persistent pain, osteoarthritis, retear of the lateral meniscus and the need for further surgery. Medical  risks include DVT and pulmonary embolism, myocardial infarction, stroke, pneumonia, respiratory failure and death. The patient understood these risks and wished to proceed.  OPERATIVE FINDINGS: Patient was found to have a displaced peripheral tear of a discoid lateral meniscus  OPERATIVE PROCEDURE: Patient was met in the preoperative area.  Her fianc and mother were at the bedside.  The operative extremity was signed with the word yes and my initials according the hospital's correct site of surgery protocol. Preop history and physical was updated.  I previously discussed the risks and benefits of surgery with the patient in my office. She was aware that the risks included but are not limited to infection, bleeding, nerve or blood vessel injury, joint stiffness or loss of motion, persistent pain and mechanical symptoms, recurrent meniscal tear and need for further surgery. Medical risks include but are not limited to DVT and pulmonary embolism, myocardial infarction, stroke, pneumonia, respiratory failure and death. Patient understood these risks and wished to proceed.   The  patient was brought to the operating room where she was placed supine on the operative table. General anesthesia was administered. The patient was prepped and draped in a sterile fashion.  A timeout was performed to verify the patient's name, date of birth, medical record number, correct site of surgery correct procedure to be performed. It was also used to verify the patient had had received antibiotics that all appropriate instruments were available in the room. Once all in attendance were in agreement, the case began.  Examination under anesthesia revealed knee range of motion from 0-140. There is a firm endpoint on Lachman's test and no anterior laxity to Lachman's tests or anterior drawer testing.  She had no instability to varus valgus stress testing at 0 and 30 degrees of flexion.  She had a negative pivot shift.   Proposed arthroscopy incisions were drawn out with a surgical marker. An 11 blade was used to establish an inferior lateral and inferomedial portals. The inferomedial portal was created using a 18-gauge spinal needle under direct visualization. A full diagnostic examination of the knee was performed including the suprapatellar pouch, patellofemoral joint, medial lateral compartments as well as the medial lateral gutters, the intercondylar notch in the posterior knee.   Patient was found to have no chondral injuries.  Medial compartment was pristine without medial meniscus tear.  There is no ACL tear.  She had no loose bodies.  Patient found to have a discoid lateral meniscus with a peripheral tear displaced into the lateral compartment.    To repair the meniscus, the patient had her knee placed in a figure 4 position. The lateral meniscus was repaired using [employer] Fast Fix 360 meniscal repair system. With the arthroscope in the lateral portal four FasT-Fix 360 curved  needles were used to place four meniscal anchors. These were done under direct visualization. These 4 needles  were placed into the meniscus in the body and posterior horn. Once the meniscus had been secured with the initial 4 anchors the meniscus was probed and found to have excellent stability.   A straight biter and meniscal shaver were then used to perform a partial lateral meniscectomy to contour the lateral meniscus to be C-shaped versus the discoid shape.    The knee was then copiously lavaged. All arthroscopic incisions removed. The 2 arthroscopy portals were closed with 4-0 nylon. Steri-Strips were applied along with a dry sterile and compressive dressing. 20 cc of 0.25% Marcaine plain was then injected intra-articularly to help with postop pain control.  Patient was placed in a hinged knee brace allowing for 0-90 of flexion. She will remain nonweightbearing on crutches for a total of 6 weeks postop.  The patient was brought to the PACU in stable condition. I scrubbed and present the entire case and all sharp, sponge and instrument counts were correct at the conclusion the case.   I spoke to the patient's family in the postop consultation room to let them know the case was performed without complication and the patient was stable in the recovery room.   Timoteo Gaul, MD

## 2018-08-28 NOTE — H&P (Signed)
The patient has been re-examined, and the chart reviewed, and there have been no interval changes to the documented history and physical.    The risks, benefits, and alternatives have been discussed at length, and the patient is willing to proceed.   

## 2018-08-28 NOTE — Anesthesia Procedure Notes (Signed)
Procedure Name: LMA Insertion Performed by: Dex Blakely, CRNA Pre-anesthesia Checklist: Patient identified, Patient being monitored, Timeout performed, Emergency Drugs available and Suction available Patient Re-evaluated:Patient Re-evaluated prior to induction Oxygen Delivery Method: Circle system utilized Preoxygenation: Pre-oxygenation with 100% oxygen Induction Type: IV induction LMA: LMA inserted LMA Size: 3.0 Tube type: Oral Number of attempts: 1 Placement Confirmation: positive ETCO2 and breath sounds checked- equal and bilateral Tube secured with: Tape Dental Injury: Teeth and Oropharynx as per pre-operative assessment        

## 2018-08-28 NOTE — Transfer of Care (Signed)
Immediate Anesthesia Transfer of Care Note  Patient: Renee Hart  Procedure(s) Performed: KNEE ARTHROSCOPY WITH MEDIAL OR LATERAL MENISECTOMY (Right Knee)  Patient Location: PACU  Anesthesia Type:General  Level of Consciousness: awake, alert  and oriented  Airway & Oxygen Therapy: Patient Spontanous Breathing  Post-op Assessment: Report given to RN and Post -op Vital signs reviewed and stable  Post vital signs: Reviewed and stable  Last Vitals:  Vitals Value Taken Time  BP 101/77 08/28/2018  4:02 PM  Temp 37.3 C 08/28/2018  4:02 PM  Pulse 96 08/28/2018  4:03 PM  Resp 24 08/28/2018  4:03 PM  SpO2 100 % 08/28/2018  4:03 PM  Vitals shown include unvalidated device data.  Last Pain:  Vitals:   08/28/18 1602  TempSrc:   PainSc: 0-No pain         Complications: No apparent anesthesia complications

## 2018-08-28 NOTE — Anesthesia Preprocedure Evaluation (Signed)
Anesthesia Evaluation  Patient identified by MRN, date of birth, ID band Patient awake    Reviewed: Allergy & Precautions, NPO status , Patient's Chart, lab work & pertinent test results  Airway Mallampati: II  TM Distance: >3 FB     Dental   Pulmonary neg pulmonary ROS,    Pulmonary exam normal        Cardiovascular negative cardio ROS Normal cardiovascular exam     Neuro/Psych negative neurological ROS  negative psych ROS   GI/Hepatic Neg liver ROS, GERD  Controlled,  Endo/Other  negative endocrine ROS  Renal/GU negative Renal ROS  negative genitourinary   Musculoskeletal   Abdominal Normal abdominal exam  (+)   Peds negative pediatric ROS (+)  Hematology negative hematology ROS (+)   Anesthesia Other Findings   Reproductive/Obstetrics                             Anesthesia Physical Anesthesia Plan  ASA: II  Anesthesia Plan: General   Post-op Pain Management:    Induction: Intravenous  PONV Risk Score and Plan:   Airway Management Planned: LMA  Additional Equipment:   Intra-op Plan:   Post-operative Plan: Extubation in OR  Informed Consent: I have reviewed the patients History and Physical, chart, labs and discussed the procedure including the risks, benefits and alternatives for the proposed anesthesia with the patient or authorized representative who has indicated his/her understanding and acceptance.   Dental advisory given  Plan Discussed with: CRNA and Surgeon  Anesthesia Plan Comments:         Anesthesia Quick Evaluation

## 2018-08-28 NOTE — Anesthesia Post-op Follow-up Note (Signed)
Anesthesia QCDR form completed.        

## 2018-08-29 ENCOUNTER — Encounter: Payer: Self-pay | Admitting: Orthopedic Surgery

## 2018-09-06 DIAGNOSIS — M25661 Stiffness of right knee, not elsewhere classified: Secondary | ICD-10-CM | POA: Diagnosis not present

## 2018-09-06 DIAGNOSIS — M25561 Pain in right knee: Secondary | ICD-10-CM | POA: Diagnosis not present

## 2018-09-09 DIAGNOSIS — M25561 Pain in right knee: Secondary | ICD-10-CM | POA: Diagnosis not present

## 2018-09-09 DIAGNOSIS — M25661 Stiffness of right knee, not elsewhere classified: Secondary | ICD-10-CM | POA: Diagnosis not present

## 2018-09-17 DIAGNOSIS — M25661 Stiffness of right knee, not elsewhere classified: Secondary | ICD-10-CM | POA: Diagnosis not present

## 2018-09-17 DIAGNOSIS — M25561 Pain in right knee: Secondary | ICD-10-CM | POA: Diagnosis not present

## 2018-09-19 DIAGNOSIS — M25561 Pain in right knee: Secondary | ICD-10-CM | POA: Diagnosis not present

## 2018-09-19 DIAGNOSIS — M25661 Stiffness of right knee, not elsewhere classified: Secondary | ICD-10-CM | POA: Diagnosis not present

## 2018-09-30 DIAGNOSIS — M25561 Pain in right knee: Secondary | ICD-10-CM | POA: Diagnosis not present

## 2018-09-30 DIAGNOSIS — M25661 Stiffness of right knee, not elsewhere classified: Secondary | ICD-10-CM | POA: Diagnosis not present

## 2018-10-03 DIAGNOSIS — M25661 Stiffness of right knee, not elsewhere classified: Secondary | ICD-10-CM | POA: Diagnosis not present

## 2018-10-03 DIAGNOSIS — M25561 Pain in right knee: Secondary | ICD-10-CM | POA: Diagnosis not present

## 2018-10-04 DIAGNOSIS — M25561 Pain in right knee: Secondary | ICD-10-CM | POA: Diagnosis not present

## 2018-10-04 DIAGNOSIS — M25661 Stiffness of right knee, not elsewhere classified: Secondary | ICD-10-CM | POA: Diagnosis not present

## 2018-10-11 DIAGNOSIS — M25561 Pain in right knee: Secondary | ICD-10-CM | POA: Diagnosis not present

## 2018-10-11 DIAGNOSIS — M25661 Stiffness of right knee, not elsewhere classified: Secondary | ICD-10-CM | POA: Diagnosis not present

## 2018-12-03 DIAGNOSIS — L700 Acne vulgaris: Secondary | ICD-10-CM | POA: Diagnosis not present

## 2018-12-03 DIAGNOSIS — D225 Melanocytic nevi of trunk: Secondary | ICD-10-CM | POA: Diagnosis not present

## 2018-12-13 ENCOUNTER — Other Ambulatory Visit: Payer: Self-pay

## 2018-12-13 MED ORDER — LEVONORGEST-ETH ESTRAD 91-DAY 0.15-0.03 MG PO TABS: 1.0000 | ORAL_TABLET | Freq: Every day | ORAL | 4 refills | Status: DC

## 2018-12-27 DIAGNOSIS — M65861 Other synovitis and tenosynovitis, right lower leg: Secondary | ICD-10-CM | POA: Diagnosis not present

## 2019-05-16 DIAGNOSIS — L700 Acne vulgaris: Secondary | ICD-10-CM | POA: Diagnosis not present

## 2019-05-16 DIAGNOSIS — L814 Other melanin hyperpigmentation: Secondary | ICD-10-CM | POA: Diagnosis not present

## 2020-03-16 NOTE — Patient Instructions (Addendum)
Preventive Care 21-30 Years Old, Female Preventive care refers to visits with your health care provider and lifestyle choices that can promote health and wellness. This includes:  A yearly physical exam. This may also be called an annual well check.  Regular dental visits and eye exams.  Immunizations.  Screening for certain conditions.  Healthy lifestyle choices, such as eating a healthy diet, getting regular exercise, not using drugs or products that contain nicotine and tobacco, and limiting alcohol use. What can I expect for my preventive care visit? Physical exam Your health care provider will check your:  Height and weight. This may be used to calculate body mass index (BMI), which tells if you are at a healthy weight.  Heart rate and blood pressure.  Skin for abnormal spots. Counseling Your health care provider may ask you questions about your:  Alcohol, tobacco, and drug use.  Emotional well-being.  Home and relationship well-being.  Sexual activity.  Eating habits.  Work and work environment.  Method of birth control.  Menstrual cycle.  Pregnancy history. What immunizations do I need?  Influenza (flu) vaccine  This is recommended every year. Tetanus, diphtheria, and pertussis (Tdap) vaccine  You may need a Td booster every 10 years. Varicella (chickenpox) vaccine  You may need this if you have not been vaccinated. Human papillomavirus (HPV) vaccine  If recommended by your health care provider, you may need three doses over 6 months. Measles, mumps, and rubella (MMR) vaccine  You may need at least one dose of MMR. You may also need a second dose. Meningococcal conjugate (MenACWY) vaccine  One dose is recommended if you are age 19-21 years and a first-year college student living in a residence hall, or if you have one of several medical conditions. You may also need additional booster doses. Pneumococcal conjugate (PCV13) vaccine  You may need  this if you have certain conditions and were not previously vaccinated. Pneumococcal polysaccharide (PPSV23) vaccine  You may need one or two doses if you smoke cigarettes or if you have certain conditions. Hepatitis A vaccine  You may need this if you have certain conditions or if you travel or work in places where you may be exposed to hepatitis A. Hepatitis B vaccine  You may need this if you have certain conditions or if you travel or work in places where you may be exposed to hepatitis B. Haemophilus influenzae type b (Hib) vaccine  You may need this if you have certain conditions. You may receive vaccines as individual doses or as more than one vaccine together in one shot (combination vaccines). Talk with your health care provider about the risks and benefits of combination vaccines. What tests do I need?  Blood tests  Lipid and cholesterol levels. These may be checked every 5 years starting at age 20.  Hepatitis C test.  Hepatitis B test. Screening  Diabetes screening. This is done by checking your blood sugar (glucose) after you have not eaten for a while (fasting).  Sexually transmitted disease (STD) testing.  BRCA-related cancer screening. This may be done if you have a family history of breast, ovarian, tubal, or peritoneal cancers.  Pelvic exam and Pap test. This may be done every 3 years starting at age 21. Starting at age 30, this may be done every 5 years if you have a Pap test in combination with an HPV test. Talk with your health care provider about your test results, treatment options, and if necessary, the need for more tests.   Follow these instructions at home: Eating and drinking   Eat a diet that includes fresh fruits and vegetables, whole grains, lean protein, and low-fat dairy.  Take vitamin and mineral supplements as recommended by your health care provider.  Do not drink alcohol if: ? Your health care provider tells you not to drink. ? You are  pregnant, may be pregnant, or are planning to become pregnant.  If you drink alcohol: ? Limit how much you have to 0-1 drink a day. ? Be aware of how much alcohol is in your drink. In the [place] one drink equals one 12 oz bottle of beer (355 mL), one 5 oz glass of wine (148 mL), or one 1 oz glass of hard liquor (44 mL). Lifestyle  Take daily care of your teeth and gums.  Stay active. Exercise for at least 30 minutes on 5 or more days each week.  Do not use any products that contain nicotine or tobacco, such as cigarettes, e-cigarettes, and chewing tobacco. If you need help quitting, ask your health care provider.  If you are sexually active, practice safe sex. Use a condom or other form of birth control (contraception) in order to prevent pregnancy and STIs (sexually transmitted infections). If you plan to become pregnant, see your health care provider for a preconception visit. What's next?  Visit your health care provider once a year for a well check visit.  Ask your health care provider how often you should have your eyes and teeth checked.  Stay up to date on all vaccines. This information is not intended to replace advice given to you by your health care provider. Make sure you discuss any questions you have with your health care provider. Document Revised: 06/13/2018 Document Reviewed: 06/13/2018 Elsevier Patient Education  2020 Elsevier Inc. Breast Self-Awareness Breast self-awareness is knowing how your breasts look and feel. Doing breast self-awareness is important. It allows you to catch a breast problem early while it is still small and can be treated. All women should do breast self-awareness, including women who have had breast implants. Tell your doctor if you notice a change in your breasts. What you need:  A mirror.  A well-lit room. How to do a breast self-exam A breast self-exam is one way to learn what is normal for your breasts and to check for changes. To do a  breast self-exam: Look for changes  1. Take off all the clothes above your waist. 2. Stand in front of a mirror in a room with good lighting. 3. Put your hands on your hips. 4. Push your hands down. 5. Look at your breasts and nipples in the mirror to see if one breast or nipple looks different from the other. Check to see if: ? The shape of one breast is different. ? The size of one breast is different. ? There are wrinkles, dips, and bumps in one breast and not the other. 6. Look at each breast for changes in the skin, such as: ? Redness. ? Scaly areas. 7. Look for changes in your nipples, such as: ? Liquid around the nipples. ? Bleeding. ? Dimpling. ? Redness. ? A change in where the nipples are. Feel for changes  1. Lie on your back on the floor. 2. Feel each breast. To do this, follow these steps: ? Pick a breast to feel. ? Put the arm closest to that breast above your head. ? Use your other arm to feel the nipple area of your breast. Feel   the area with the pads of your three middle fingers by making small circles with your fingers. For the first circle, press lightly. For the second circle, press harder. For the third circle, press even harder. ? Keep making circles with your fingers at the different pressures as you move down your breast. Stop when you feel your ribs. ? Move your fingers a little toward the center of your body. ? Start making circles with your fingers again, this time going up until you reach your collarbone. ? Keep making up-and-down circles until you reach your armpit. Remember to keep using the three pressures. ? Feel the other breast in the same way. 3. Sit or stand in the tub or shower. 4. With soapy water on your skin, feel each breast the same way you did in step 2 when you were lying on the floor. Write down what you find Writing down what you find can help you remember what to tell your doctor. Write down:  What is normal for each breast.  Any  changes you find in each breast, including: ? The kind of changes you find. ? Whether you have pain. ? Size and location of any lumps.  When you last had your menstrual period. General tips  Check your breasts every month.  If you are breastfeeding, the best time to check your breasts is after you feed your baby or after you use a breast pump.  If you get menstrual periods, the best time to check your breasts is 5-7 days after your menstrual period is over.  With time, you will become comfortable with the self-exam, and you will begin to know if there are changes in your breasts. Contact a doctor if you:  See a change in the shape or size of your breasts or nipples.  See a change in the skin of your breast or nipples, such as red or scaly skin.  Have fluid coming from your nipples that is not normal.  Find a lump or thick area that was not there before.  Have pain in your breasts.  Have any concerns about your breast health. Summary  Breast self-awareness includes looking for changes in your breasts, as well as feeling for changes within your breasts.  Breast self-awareness should be done in front of a mirror in a well-lit room.  You should check your breasts every month. If you get menstrual periods, the best time to check your breasts is 5-7 days after your menstrual period is over.  Let your doctor know of any changes you see in your breasts, including changes in size, changes on the skin, pain or tenderness, or fluid from your nipples that is not normal. This information is not intended to replace advice given to you by your health care provider. Make sure you discuss any questions you have with your health care provider. Document Revised: 05/21/2018 Document Reviewed: 05/21/2018 Elsevier Patient Education  2020 Elsevier Inc.   Preparing for Pregnancy If you are considering becoming pregnant, make an appointment to see your regular health care provider to learn how to  prepare for a safe and healthy pregnancy (preconception care). During a preconception care visit, your health care provider will:  Do a complete physical exam, including a Pap test.  Take a complete medical history.  Give you information, answer your questions, and help you resolve problems. Preconception checklist Medical history  Tell your health care provider about any current or past medical conditions. Your pregnancy or your ability to   become pregnant may be affected by chronic conditions, such as diabetes, chronic hypertension, and thyroid problems.  Include your family's medical history as well as your partner's medical history.  Tell your health care provider about any history of STIs (sexually transmitted infections).These can affect your pregnancy. In some cases, they can be passed to your baby. Discuss any concerns that you have about STIs.  If indicated, discuss the benefits of genetic testing. This testing will show whether there are any genetic conditions that may be passed from you or your partner to your baby.  Tell your health care provider about: ? Any problems you have had with conception or pregnancy. ? Any medicines you take. These include vitamins, herbal supplements, and over-the-counter medicines. ? Your history of immunizations. Discuss any vaccinations that you may need. Diet  Ask your health care provider what to include in a healthy diet that has a balance of nutrients. This is especially important when you are pregnant or preparing to become pregnant.  Ask your health care provider to help you reach a healthy weight before pregnancy. ? If you are overweight, you may be at higher risk for certain complications, such as high blood pressure, diabetes, and preterm birth. ? If you are underweight, you are more likely to have a baby who has a low birth weight. Lifestyle, work, and home  Let your health care provider know: ? About any lifestyle habits that you  have, such as alcohol use, drug use, or smoking. ? About recreational activities that may put you at risk during pregnancy, such as downhill skiing and certain exercise programs. ? Tell your health care provider about any international travel, especially any travel to places with an active Zika virus outbreak. ? About harmful substances that you may be exposed to at work or at home. These include chemicals, pesticides, radiation, or even litter boxes. ? If you do not feel safe at home. Mental health  Tell your health care provider about: ? Any history of mental health conditions, including feelings of depression, sadness, or anxiety. ? Any medicines that you take for a mental health condition. These include herbs and supplements. Home instructions to prepare for pregnancy Lifestyle   Eat a balanced diet. This includes fresh fruits and vegetables, whole grains, lean meats, low-fat dairy products, healthy fats, and foods that are high in fiber. Ask to meet with a nutritionist or registered dietitian for assistance with meal planning and goals.  Get regular exercise. Try to be active for at least 30 minutes a day on most days of the week. Ask your health care provider which activities are safe during pregnancy.  Do not use any products that contain nicotine or tobacco, such as cigarettes and e-cigarettes. If you need help quitting, ask your health care provider.  Do not drink alcohol.  Do not take illegal drugs.  Maintain a healthy weight. Ask your health care provider what weight range is right for you. General instructions  Keep an accurate record of your menstrual periods. This makes it easier for your health care provider to determine your baby's due date.  Begin taking prenatal vitamins and folic acid supplements daily as directed by your health care provider.  Manage any chronic conditions, such as high blood pressure and diabetes, as told by your health care provider. This is  important. How do I know that I am pregnant? You may be pregnant if you have been sexually active and you miss your period. Symptoms of early pregnancy include:    Mild cramping.  Very light vaginal bleeding (spotting).  Feeling unusually tired.  Nausea and vomiting (morning sickness). If you have any of these symptoms and you suspect that you might be pregnant, you can take a home pregnancy test. These tests check for a hormone in your urine (human chorionic gonadotropin, or hCG). A woman's body begins to make this hormone during early pregnancy. These tests are very accurate. Wait until at least the first day after you miss your period to take one. If the test shows that you are pregnant (you get a positive result), call your health care provider to make an appointment for prenatal care. What should I do if I become pregnant?      Make an appointment with your health care provider as soon as you suspect you are pregnant.  Do not use any products that contain nicotine, such as cigarettes, chewing tobacco, and e-cigarettes. If you need help quitting, ask your health care provider.  Do not drink alcoholic beverages. Alcohol is related to a number of birth defects.  Avoid toxic odors and chemicals.  You may continue to have sexual intercourse if it does not cause pain or other problems, such as vaginal bleeding. This information is not intended to replace advice given to you by your health care provider. Make sure you discuss any questions you have with your health care provider. Document Revised: 10/04/2017 Document Reviewed: 04/23/2016 Elsevier Patient Education  2020 Elsevier Inc.  

## 2020-03-16 NOTE — Progress Notes (Signed)
Pt present for annual exam. Pt stated that she was doing well no problems.  

## 2020-03-17 ENCOUNTER — Other Ambulatory Visit: Payer: Self-pay

## 2020-03-17 ENCOUNTER — Encounter: Payer: Self-pay | Admitting: Obstetrics and Gynecology

## 2020-03-17 ENCOUNTER — Ambulatory Visit (INDEPENDENT_AMBULATORY_CARE_PROVIDER_SITE_OTHER): Payer: 59 | Admitting: Obstetrics and Gynecology

## 2020-03-17 VITALS — BP 113/86 | HR 72 | Ht 64.0 in | Wt 130.4 lb

## 2020-03-17 DIAGNOSIS — Z01419 Encounter for gynecological examination (general) (routine) without abnormal findings: Secondary | ICD-10-CM | POA: Diagnosis not present

## 2020-03-17 DIAGNOSIS — Z8041 Family history of malignant neoplasm of ovary: Secondary | ICD-10-CM

## 2020-03-17 NOTE — Progress Notes (Signed)
GYNECOLOGY ANNUAL PHYSICAL EXAM PROGRESS NOTE  Subjective:    Renee Hart is a 29 y.o. [place] female who presents for an annual exam She has no complaints today. The patient is sexually active, married. The patient wears seatbelts: yes. The patient participates in regular exercise: yes. Has the patient ever been transfused or tattooed?: no.   Gynecologic History  Menarche age: 80 or 32.  Patient's last menstrual period was 02/24/2020. Cycles last 4 days, light flow.   Contraception: condoms.  Thinks they may plan on conceiving later this year.  History of STI's: Deneis Last Pap: 03/26/2018. Results were normal.     OB History  Gravida Para Term Preterm AB Living  0 0 0 0 0 0  SAB TAB Ectopic Multiple Live Births  0 0 0 0 0    Past Medical History:  Diagnosis Date  . Medical history non-contributory     Past Surgical History:  Procedure Laterality Date  . KNEE ARTHROSCOPY WITH MEDIAL MENISECTOMY Right 08/28/2018   Procedure: KNEE ARTHROSCOPY WITH MEDIAL OR LATERAL MENISECTOMY;  Surgeon: Thornton Park, MD;  Location: [facility] ORS;  Service: Orthopedics;  Laterality: Right;  . REFRACTIVE SURGERY      Family History  Problem Relation Age of Onset  . Ovarian cancer Maternal Grandmother   . Healthy Mother   . Healthy Father     Social History   Socioeconomic History  . Marital status: Married    Spouse name: Not on file  . Number of children: Not on file  . Years of education: Not on file  . Highest education level: Not on file  Occupational History  . Not on file  Tobacco Use  . Smoking status: Never Smoker  . Smokeless tobacco: Never Used  Substance and Sexual Activity  . Alcohol use: Yes    Alcohol/week: 0.0 standard drinks    Comment: socially  . Drug use: Never  . Sexual activity: Yes    Birth control/protection: None  Other Topics Concern  . Not on file  Social History Narrative  . Not on file   Social Determinants of Health   Financial  Resource Strain:   . Difficulty of Paying Living Expenses:   Food Insecurity:   . Worried About Charity fundraiser in the Last Year:   . Arboriculturist in the Last Year:   Transportation Needs:   . Film/video editor (Medical):   Marland Kitchen Lack of Transportation (Non-Medical):   Physical Activity:   . Days of Exercise per Week:   . Minutes of Exercise per Session:   Stress:   . Feeling of Stress :   Social Connections:   . Frequency of Communication with Friends and Family:   . Frequency of Social Gatherings with Friends and Family:   . Attends Religious Services:   . Active Member of Clubs or Organizations:   . Attends Archivist Meetings:   Marland Kitchen Marital Status:   Intimate Partner Violence:   . Fear of Current or Ex-Partner:   . Emotionally Abused:   Marland Kitchen Physically Abused:   . Sexually Abused:     No current outpatient medications on file prior to visit.   No current facility-administered medications on file prior to visit.    No Known Allergies    Review of Systems Constitutional: negative for chills, fatigue, fevers and sweats Eyes: negative for irritation, redness and visual disturbance Ears, nose, mouth, throat, and face: negative for hearing loss, nasal congestion,  snoring and tinnitus Respiratory: negative for asthma, cough, sputum Cardiovascular: negative for chest pain, dyspnea, exertional chest pressure/discomfort, irregular heart beat, palpitations and syncope Gastrointestinal: negative for abdominal pain, change in bowel habits, nausea and vomiting.   Genitourinary: negative for abnormal menstrual periods, genital lesions, sexual problems and vaginal discharge, dysuria and urinary incontinence Integument/breast: negative for breast lump, breast tenderness and nipple discharge Hematologic/lymphatic: negative for bleeding and easy bruising Musculoskeletal:negative for back pain and muscle weakness Neurological: negative for dizziness, headaches, vertigo and  weakness Endocrine: negative for diabetic symptoms including polydipsia, polyuria and skin dryness Allergic/Immunologic: negative for hay fever and urticaria        Objective:  Blood pressure 113/86, pulse 72, height 5\' 4"  (1.626 m), weight 130 lb 6.4 oz (59.1 kg), last menstrual period 02/24/2020. Body mass index is 22.38 kg/m.  General Appearance:    Alert, cooperative, no distress, appears stated age  Head:    Normocephalic, without obvious abnormality, atraumatic  Eyes:    PERRL, conjunctiva/corneas clear, EOM's intact, both eyes  Ears:    Normal external ear canals, both ears  Nose:   Nares normal, septum midline, mucosa normal, no drainage or sinus tenderness  Throat:   Lips, mucosa, and tongue normal; teeth and gums normal  Neck:   Supple, symmetrical, trachea midline, no adenopathy; thyroid: no enlargement/tenderness/nodules; no carotid bruit or JVD  Back:     Symmetric, no curvature, ROM normal, no CVA tenderness  Lungs:     Clear to auscultation bilaterally, respirations unlabored  Chest Wall:    No tenderness or deformity   Heart:    Regular rate and rhythm, S1 and S2 normal, no murmur, rub or gallop  Breast Exam:    No tenderness, masses, or nipple abnormality  Abdomen:     Soft, non-tender, bowel sounds active all four quadrants, no masses, no organomegaly.    Genitalia:    Pelvic:external genitalia normal, vagina without lesions, or tenderness, small amount of thin white discharge present, no odor. Rectovaginal septum  normal. Cervix normal in appearance, no cervical motion tenderness, no adnexal masses or tenderness.  Uterus normal size, shape, mobile, regular contours, nontender.  Rectal:    Normal external sphincter.  No hemorrhoids appreciated. Internal exam not done.   Extremities:   Extremities normal, atraumatic, no cyanosis or edema  Pulses:   2+ and symmetric all extremities  Skin:   Skin color, texture, turgor normal, no rashes or lesions  Lymph nodes:   Cervical,  supraclavicular, and axillary nodes normal  Neurologic:   CNII-XII intact, normal strength, sensation and reflexes throughout    Labs:  Lab Results  Component Value Date   WBC 5.3 08/21/2018   HGB 13.2 08/21/2018   HCT 40.5 08/21/2018   MCV 98.5 08/21/2018   PLT 236 08/21/2018    Lab Results  Component Value Date   CREATININE 0.64 08/21/2018   BUN 10 08/21/2018   NA 138 08/21/2018   K 3.6 08/21/2018   CL 103 08/21/2018   CO2 28 08/21/2018    Lab Results  Component Value Date   ALT 8 03/26/2018   AST 14 03/26/2018   ALKPHOS 54 03/26/2018   BILITOT 0.5 03/26/2018    No results found for: TSH   Assessment:   Healthy female exam. Family h/o ovarian cancer  Plan:     Blood tests: CBC with diff, TSH, and Comprehensive metabolic panel. Breast self exam technique reviewed and patient encouraged to perform self-exam monthly. Contraception: condoms. Notes she may  be considering conceiving later this year. Is already taking prenatal vitamins (but finds them hard to swallow due to size. Suggested gummies). Discussed menstrual tracking and purposeful coitus during ovulation week.  Discussed healthy lifestyle modifications. Pap smear up to date.  Next due in 2022 unless 5 year screening desired.   Family h/o ovarian cancer in grandmother, patient unsure exactly what age, however does think that she was older when diagnosed. Will continue to screen annually with bimanual exam. If patient with another significant cancer in the family, should consider hereditary cancer screening.  Follow up in 1 year.     Rubie Maid, MD [facility]

## 2020-03-18 LAB — COMPREHENSIVE METABOLIC PANEL
ALT: 12 IU/L (ref 0–32)
AST: 21 IU/L (ref 0–40)
Albumin/Globulin Ratio: 1.7 (ref 1.2–2.2)
Albumin: 4.4 g/dL (ref 3.9–5.0)
Alkaline Phosphatase: 70 IU/L (ref 48–121)
BUN/Creatinine Ratio: 10 (ref 9–23)
BUN: 8 mg/dL (ref 6–20)
Bilirubin Total: 0.4 mg/dL (ref 0.0–1.2)
CO2: 24 mmol/L (ref 20–29)
Calcium: 9.6 mg/dL (ref 8.7–10.2)
Chloride: 99 mmol/L (ref 96–106)
Creatinine, Ser: 0.79 mg/dL (ref 0.57–1.00)
GFR calc Af Amer: 116 mL/min/{1.73_m2} (ref >59)
GFR calc non Af Amer: 101 mL/min/{1.73_m2} (ref >59)
Globulin, Total: 2.6 g/dL (ref 1.5–4.5)
Glucose: 101 mg/dL — ABNORMAL HIGH (ref 65–99)
Potassium: 4 mmol/L (ref 3.5–5.2)
Sodium: 136 mmol/L (ref 134–144)
Total Protein: 7 g/dL (ref 6.0–8.5)

## 2020-03-18 LAB — CBC
Hematocrit: 41.4 % (ref 34.0–46.6)
Hemoglobin: 13.8 g/dL (ref 11.1–15.9)
MCH: 32.2 pg (ref 26.6–33.0)
MCHC: 33.3 g/dL (ref 31.5–35.7)
MCV: 97 fL (ref 79–97)
Platelets: 210 10*3/uL (ref 150–450)
RBC: 4.29 x10E6/uL (ref 3.77–5.28)
RDW: 12 % (ref 11.7–15.4)
WBC: 5.2 10*3/uL (ref 3.4–10.8)

## 2020-03-18 LAB — TSH: TSH: 3.55 u[IU]/mL (ref 0.450–4.500)

## 2020-04-28 ENCOUNTER — Encounter: Payer: 59 | Admitting: Obstetrics and Gynecology

## 2020-08-31 ENCOUNTER — Encounter: Payer: Self-pay | Admitting: Obstetrics and Gynecology

## 2020-08-31 ENCOUNTER — Ambulatory Visit (INDEPENDENT_AMBULATORY_CARE_PROVIDER_SITE_OTHER): Payer: 59 | Admitting: Obstetrics and Gynecology

## 2020-08-31 ENCOUNTER — Other Ambulatory Visit: Payer: Self-pay

## 2020-08-31 VITALS — BP 137/85 | HR 88 | Ht 64.0 in | Wt 125.3 lb

## 2020-08-31 DIAGNOSIS — N926 Irregular menstruation, unspecified: Secondary | ICD-10-CM | POA: Diagnosis not present

## 2020-08-31 DIAGNOSIS — Z3201 Encounter for pregnancy test, result positive: Secondary | ICD-10-CM | POA: Diagnosis not present

## 2020-08-31 LAB — POCT URINE PREGNANCY: Preg Test, Ur: POSITIVE — AB

## 2020-08-31 NOTE — Progress Notes (Signed)
   GYNECOLOGY CLINIC PROGRESS NOTE Subjective:    Renee Hart is a 29 y.o. [place] female who presents for evaluation of amenorrhea. She believes she could be pregnant. Pregnancy is desired and was planned. Sexual Activity: single partner, contraception: none. Current symptoms also include: positive home pregnancy test. Last period was normal.  Patient's last menstrual period was 07/28/2020.    The following portions of the patient's history were reviewed and updated as appropriate: allergies, current medications, past family history, past medical history, past social history, past surgical history and problem list.  Review of Systems Pertinent items noted in HPI and remainder of comprehensive ROS otherwise negative.   Objective:    BP 137/85   Pulse 88   Ht 5\' 4"  (1.626 m)   Wt 125 lb 4.8 oz (56.8 kg)   LMP 07/28/2020   BMI 21.51 kg/m  General: alert and no distress    Lab Review Urine HCG: positive    Assessment:   1. Missed menses   2. Pregnancy test positive    Plan:    Pregnancy Test: Positive: EDC: 05/04/2021, EGA 4.6 weeks. Briefly discussed pre-natal care options. Pregnancy, Childbirth and the Newborn book given. Encouraged well-balanced diet, plenty of rest when needed, pre-natal vitamins daily and walking for exercise. Discussed self-help for nausea, avoiding OTC medications until consulting provider or pharmacist, other than Tylenol as needed, minimal caffeine (1-2 cups daily) and avoiding alcohol. She will schedule her new OB intake visit in the next month with her PCP or OB provider. Feel free to call with any questions.   Rubie Maid, MD [facility]

## 2020-08-31 NOTE — Patient Instructions (Signed)
WHAT OB PATIENTS CAN EXPECT   Confirmation of pregnancy and ultrasound ordered if medically indicated-[redacted] weeks gestation  New OB (NOB) intake with nurse and New OB (NOB) labs- [redacted] weeks gestation  New OB (NOB) physical examination with provider- 11/[redacted] weeks gestation  Flu vaccine-[redacted] weeks gestation  Anatomy scan-[redacted] weeks gestation  Glucose tolerance test, blood work to test for anemia, T-dap vaccine-[redacted] weeks gestation  Vaginal swabs/cultures-STD/Group B strep-[redacted] weeks gestation  Appointments every 4 weeks until 28 weeks  Every 2 weeks from 28 weeks until 36 weeks  Weekly visits from 36 weeks until delivery  Morning Sickness  Morning sickness is when you feel sick to your stomach (nauseous) during pregnancy. You may feel sick to your stomach and throw up (vomit). You may feel sick in the morning, but you can feel this way at any time of day. Some women feel very sick to their stomach and cannot stop throwing up (hyperemesis gravidarum). Follow these instructions at home: Medicines  Take over-the-counter and prescription medicines only as told by your doctor. Do not take any medicines until you talk with your doctor about them first.  Taking multivitamins before getting pregnant can stop or lessen the harshness of morning sickness. Eating and drinking  Eat dry toast or crackers before getting out of bed.  Eat 5 or 6 small meals a day.  Eat dry and bland foods like rice and baked potatoes.  Do not eat greasy, fatty, or spicy foods.  Have someone cook for you if the smell of food causes you to feel sick or throw up.  If you feel sick to your stomach after taking prenatal vitamins, take them at night or with a snack.  Eat protein when you need a snack. Nuts, yogurt, and cheese are good choices.  Drink fluids throughout the day.  Try ginger ale made with real ginger, ginger tea made from fresh grated ginger, or ginger candies. General instructions  Do not use any products  that have nicotine or tobacco in them, such as cigarettes and e-cigarettes. If you need help quitting, ask your doctor.  Use an air purifier to keep the air in your house free of smells.  Get lots of fresh air.  Try to avoid smells that make you feel sick.  Try: ? Wearing a bracelet that is used for seasickness (acupressure wristband). ? Going to a doctor who puts thin needles into certain body points (acupuncture) to improve how you feel. Contact a doctor if:  You need medicine to feel better.  You feel dizzy or light-headed.  You are losing weight. Get help right away if:  You feel very sick to your stomach and cannot stop throwing up.  You pass out (faint).  You have very bad pain in your belly. Summary  Morning sickness is when you feel sick to your stomach (nauseous) during pregnancy.  You may feel sick in the morning, but you can feel this way at any time of day.  Making some changes to what you eat may help your symptoms go away. This information is not intended to replace advice given to you by your health care provider. Make sure you discuss any questions you have with your health care provider. Document Revised: 09/14/2017 Document Reviewed: 11/02/2016 Elsevier Patient Education  [place] of Pregnancy  The first trimester of pregnancy is from week 1 until the end of week 13 (months 1 through 3). During this time, your baby will begin to develop inside  you. At 6-8 weeks, the eyes and face are formed, and the heartbeat can be seen on ultrasound. At the end of 12 weeks, all the baby's organs are formed. Prenatal care is all the medical care you receive before the birth of your baby. Make sure you get good prenatal care and follow all of your doctor's instructions. Follow these instructions at home: Medicines  Take over-the-counter and prescription medicines only as told by your doctor. Some medicines are safe and some medicines are not safe  during pregnancy.  Take a prenatal vitamin that contains at least 600 micrograms (mcg) of folic acid.  If you have trouble pooping (constipation), take medicine that will make your stool soft (stool softener) if your doctor approves. Eating and drinking   Eat regular, healthy meals.  Your doctor will tell you the amount of weight gain that is right for you.  Avoid raw meat and uncooked cheese.  If you feel sick to your stomach (nauseous) or throw up (vomit): ? Eat 4 or 5 small meals a day instead of 3 large meals. ? Try eating a few soda crackers. ? Drink liquids between meals instead of during meals.  To prevent constipation: ? Eat foods that are high in fiber, like fresh fruits and vegetables, whole grains, and beans. ? Drink enough fluids to keep your pee (urine) clear or pale yellow. Activity  Exercise only as told by your doctor. Stop exercising if you have cramps or pain in your lower belly (abdomen) or low back.  Do not exercise if it is too hot, too humid, or if you are in a place of great height (high altitude).  Try to avoid standing for long periods of time. Move your legs often if you must stand in one place for a long time.  Avoid heavy lifting.  Wear low-heeled shoes. Sit and stand up straight.  You can have sex unless your doctor tells you not to. Relieving pain and discomfort  Wear a good support bra if your breasts are sore.  Take warm water baths (sitz baths) to soothe pain or discomfort caused by hemorrhoids. Use hemorrhoid cream if your doctor says it is okay.  Rest with your legs raised if you have leg cramps or low back pain.  If you have puffy, bulging veins (varicose veins) in your legs: ? Wear support hose or compression stockings as told by your doctor. ? Raise (elevate) your feet for 15 minutes, 3-4 times a day. ? Limit salt in your food. Prenatal care  Schedule your prenatal visits by the twelfth week of pregnancy.  Write down your  questions. Take them to your prenatal visits.  Keep all your prenatal visits as told by your doctor. This is important. Safety  Wear your seat belt at all times when driving.  Make a list of emergency phone numbers. The list should include numbers for family, friends, the hospital, and police and fire departments. General instructions  Ask your doctor for a referral to a local prenatal class. Begin classes no later than at the start of month 6 of your pregnancy.  Ask for help if you need counseling or if you need help with nutrition. Your doctor can give you advice or tell you where to go for help.  Do not use hot tubs, steam rooms, or saunas.  Do not douche or use tampons or scented sanitary pads.  Do not cross your legs for long periods of time.  Avoid all herbs and alcohol. Avoid drugs  that are not approved by your doctor.  Do not use any tobacco products, including cigarettes, chewing tobacco, and electronic cigarettes. If you need help quitting, ask your doctor. You may get counseling or other support to help you quit.  Avoid cat litter boxes and soil used by cats. These carry germs that can cause birth defects in the baby and can cause a loss of your baby (miscarriage) or stillbirth.  Visit your dentist. At home, brush your teeth with a soft toothbrush. Be gentle when you floss. Contact a doctor if:  You are dizzy.  You have mild cramps or pressure in your lower belly.  You have a nagging pain in your belly area.  You continue to feel sick to your stomach, you throw up, or you have watery poop (diarrhea).  You have a bad smelling fluid coming from your vagina.  You have pain when you pee (urinate).  You have increased puffiness (swelling) in your face, hands, legs, or ankles. Get help right away if:  You have a fever.  You are leaking fluid from your vagina.  You have spotting or bleeding from your vagina.  You have very bad belly cramping or pain.  You gain  or lose weight rapidly.  You throw up blood. It may look like coffee grounds.  You are around people who have [place] measles, fifth disease, or chickenpox.  You have a very bad headache.  You have shortness of breath.  You have any kind of trauma, such as from a fall or a car accident. Summary  The first trimester of pregnancy is from week 1 until the end of week 13 (months 1 through 3).  To take care of yourself and your unborn baby, you will need to eat healthy meals, take medicines only if your doctor tells you to do so, and do activities that are safe for you and your baby.  Keep all follow-up visits as told by your doctor. This is important as your doctor will have to ensure that your baby is healthy and growing well. This information is not intended to replace advice given to you by your health care provider. Make sure you discuss any questions you have with your health care provider. Document Revised: 01/23/2019 Document Reviewed: 10/10/2016 Elsevier Patient Education  2020 [employer]. Commonly Asked Questions During Pregnancy  Cats: A parasite can be excreted in cat feces.  To avoid exposure you need to have another person empty the little box.  If you must empty the litter box you will need to wear gloves.  Wash your hands after handling your cat.  This parasite can also be found in raw or undercooked meat so this should also be avoided.  Colds, Sore Throats, Flu: Please check your medication sheet to see what you can take for symptoms.  If your symptoms are unrelieved by these medications please call the office.  Dental Work: Most any dental work Investment banker, corporate recommends is permitted.  X-rays should only be taken during the first trimester if absolutely necessary.  Your abdomen should be shielded with a lead apron during all x-rays.  Please notify your provider prior to receiving any x-rays.  Novocaine is fine; gas is not recommended.  If your dentist requires a note from [place]  prior to dental work please call the office and we will provide one for you.  Exercise: Exercise is an important part of staying healthy during your pregnancy.  You may continue most exercises you were accustomed to  prior to pregnancy.  Later in your pregnancy you will most likely notice you have difficulty with activities requiring balance like riding a bicycle.  It is important that you listen to your body and avoid activities that put you at a higher risk of falling.  Adequate rest and staying well hydrated are a must!  If you have questions about the safety of specific activities ask your provider.    Exposure to Children with illness: Try to avoid obvious exposure; report any symptoms to [place] when noted,  If you have chicken pos, red measles or mumps, you should be immune to these diseases.   Please do not take any vaccines while pregnant unless you have checked with your OB provider.  Fetal Movement: After 28 weeks we recommend you do "kick counts" twice daily.  Lie or sit down in a calm quiet environment and count your baby movements "kicks".  You should feel your baby at least 10 times per hour.  If you have not felt 10 kicks within the first hour get up, walk around and have something sweet to eat or drink then repeat for an additional hour.  If count remains less than 10 per hour notify your provider.  Fumigating: Follow your pest control agent's advice as to how long to stay out of your home.  Ventilate the area well before re-entering.  Hemorrhoids:   Most over-the-counter preparations can be used during pregnancy.  Check your medication to see what is safe to use.  It is important to use a stool softener or fiber in your diet and to drink lots of liquids.  If hemorrhoids seem to be getting worse please call the office.   Hot Tubs:  Hot tubs Jacuzzis and saunas are not recommended while pregnant.  These increase your internal body temperature and should be avoided.  Intercourse:  Sexual  intercourse is safe during pregnancy as long as you are comfortable, unless otherwise advised by your provider.  Spotting may occur after intercourse; report any bright red bleeding that is heavier than spotting.  Labor:  If you know that you are in labor, please go to the hospital.  If you are unsure, please call the office and let us help you decide what to do.  Lifting, straining, etc:  If your job requires heavy lifting or straining please check with your provider for any limitations.  Generally, you should not lift items heavier than that you can lift simply with your hands and arms (no back muscles)  Painting:  Paint fumes do not harm your pregnancy, but may make you ill and should be avoided if possible.  Latex or water based paints have less odor than oils.  Use adequate ventilation while painting.  Permanents & Hair Color:  Chemicals in hair dyes are not recommended as they cause increase hair dryness which can increase hair loss during pregnancy.  " Highlighting" and permanents are allowed.  Dye may be absorbed differently and permanents may not hold as well during pregnancy.  Sunbathing:  Use a sunscreen, as skin burns easily during pregnancy.  Drink plenty of fluids; avoid over heating.  Tanning Beds:  Because their possible side effects are still unknown, tanning beds are not recommended.  Ultrasound Scans:  Routine ultrasounds are performed at approximately 20 weeks.  You will be able to see your baby's general anatomy an if you would like to know the gender this can usually be determined as well.  If it is questionable when you conceived  you may also receive an ultrasound early in your pregnancy for dating purposes.  Otherwise ultrasound exams are not routinely performed unless there is a medical necessity.  Although you can request a scan we ask that you pay for it when conducted because insurance does not cover " patient request" scans.  Work: If your pregnancy proceeds without  complications you may work until your due date, unless your physician or employer advises otherwise.  Round Ligament Pain/Pelvic Discomfort:  Sharp, shooting pains not associated with bleeding are fairly common, usually occurring in the second trimester of pregnancy.  They tend to be worse when standing up or when you remain standing for long periods of time.  These are the result of pressure of certain pelvic ligaments called "round ligaments".  Rest, Tylenol and heat seem to be the most effective relief.  As the womb and fetus grow, they rise out of the pelvis and the discomfort improves.  Please notify the office if your pain seems different than that described.  It may represent a more serious condition.  Common Medications Safe in Pregnancy  Acne:      Constipation:  Benzoyl Peroxide     Colace  Clindamycin      Dulcolax Suppository  Topica Erythromycin     Fibercon  Salicylic Acid      Metamucil         Miralax AVOID:        Senakot   Accutane    Cough:  Retin-A       Cough Drops  Tetracycline      Phenergan w/ Codeine if Rx  Minocycline      Robitussin (Plain & DM)  Antibiotics:     Crabs/Lice:  Ceclor       RID  Cephalosporins    AVOID:  E-Mycins      Kwell  Keflex  Macrobid/Macrodantin   Diarrhea:  Penicillin      Kao-Pectate  Zithromax      Imodium AD         PUSH FLUIDS AVOID:       Cipro     Fever:  Tetracycline      Tylenol (Regular or Extra  Minocycline       Strength)  Levaquin      Extra Strength-Do not          Exceed 8 tabs/24 hrs Caffeine:        <244m/day (equiv. To 1 cup of coffee or  approx. 3 12 oz sodas)         Gas: Cold/Hayfever:       Gas-X  Benadryl      Mylicon  Claritin       Phazyme  **Claritin-D        Chlor-Trimeton    Headaches:  Dimetapp      ASA-Free Excedrin  Drixoral-Non-Drowsy     Cold Compress  Mucinex (Guaifenasin)     Tylenol (Regular or Extra  Sudafed/Sudafed-12 Hour     Strength)  **Sudafed PE Pseudoephedrine   Tylenol Cold &  Sinus     Vicks Vapor Rub  Zyrtec  **AVOID if Problems With Blood Pressure         Heartburn: Avoid lying down for at least 1 hour after meals  Aciphex      Maalox     Rash:  Milk of Magnesia     Benadryl    Mylanta       1% Hydrocortisone Cream  Pepcid  Pepcid Complete  Sleep Aids:  Prevacid      Ambien   Prilosec       Benadryl  Rolaids       Chamomile Tea  Tums (Limit 4/day)     Unisom         Tylenol PM         Warm milk-add vanilla or  Hemorrhoids:       Sugar for taste  Anusol/Anusol H.C.  (RX: Analapram 2.5%)  Sugar Substitutes:  Hydrocortisone OTC     Ok in moderation  Preparation H      Tucks        Vaseline lotion applied to tissue with wiping    Herpes:     Throat:  Acyclovir      Oragel  Famvir  Valtrex     Vaccines:         Flu Shot Leg Cramps:       *Gardasil  Benadryl      Hepatitis A         Hepatitis B Nasal Spray:       Pneumovax  Saline Nasal Spray     Polio Booster         Tetanus Nausea:       Tuberculosis test or PPD  Vitamin B6 25 mg TID   AVOID:    Dramamine      *Gardasil  Emetrol       Live Poliovirus  Ginger Root 250 mg QID    MMR (measles, mumps &  High Complex Carbs @ Bedtime    rebella)  Sea Bands-Accupressure    Varicella (Chickenpox)  Unisom 1/2 tab TID     *No known complications           If received before Pain:         Known pregnancy;   Darvocet       Resume series after  Lortab        Delivery  Percocet    Yeast:   Tramadol      Femstat  Tylenol 3      Gyne-lotrimin  Ultram       Monistat  Vicodin           MISC:         All Sunscreens           Hair Coloring/highlights          Insect Repellant's          (Including DEET)         Mystic Tans

## 2020-08-31 NOTE — Progress Notes (Signed)
PT is present today for confirmation of pregnancy. Pt LMP 07/28/2020. UPT done today results were positive. Pt stated that she is doing well no complaints.

## 2020-09-15 ENCOUNTER — Encounter: Payer: 59 | Admitting: Obstetrics and Gynecology

## 2020-09-17 ENCOUNTER — Telehealth: Payer: Self-pay

## 2020-09-17 NOTE — Telephone Encounter (Signed)
Called pt she is on the sch for a nurse visit on 12/8 we dont have a nurse that day. The pt stated that she cant do Tuesday of Thursday. I offered her after and she said she will have to call me back and talk to her boss.

## 2020-09-21 ENCOUNTER — Ambulatory Visit (INDEPENDENT_AMBULATORY_CARE_PROVIDER_SITE_OTHER): Payer: 59

## 2020-09-21 ENCOUNTER — Ambulatory Visit (INDEPENDENT_AMBULATORY_CARE_PROVIDER_SITE_OTHER): Payer: 59 | Admitting: Obstetrics and Gynecology

## 2020-09-21 ENCOUNTER — Other Ambulatory Visit: Payer: 59

## 2020-09-21 ENCOUNTER — Other Ambulatory Visit: Payer: Self-pay

## 2020-09-21 VITALS — BP 108/77 | HR 96 | Ht 64.0 in | Wt 125.7 lb

## 2020-09-21 DIAGNOSIS — Z3401 Encounter for supervision of normal first pregnancy, first trimester: Secondary | ICD-10-CM

## 2020-09-21 DIAGNOSIS — Z7689 Persons encountering health services in other specified circumstances: Secondary | ICD-10-CM | POA: Diagnosis not present

## 2020-09-21 DIAGNOSIS — Z3A01 Less than 8 weeks gestation of pregnancy: Secondary | ICD-10-CM | POA: Diagnosis not present

## 2020-09-21 DIAGNOSIS — N926 Irregular menstruation, unspecified: Secondary | ICD-10-CM

## 2020-09-21 LAB — OB RESULTS CONSOLE VARICELLA ZOSTER ANTIBODY, IGG: Varicella: IMMUNE

## 2020-09-21 NOTE — Patient Instructions (Signed)
WHAT OB PATIENTS CAN EXPECT   Confirmation of pregnancy and ultrasound ordered if medically indicated-[redacted] weeks gestation  New OB (NOB) intake with nurse and New OB (NOB) labs- [redacted] weeks gestation  New OB (NOB) physical examination with provider- 11/[redacted] weeks gestation  Flu vaccine-[redacted] weeks gestation  Anatomy scan-[redacted] weeks gestation  Glucose tolerance test, blood work to test for anemia, T-dap vaccine-[redacted] weeks gestation  Vaginal swabs/cultures-STD/Group B strep-[redacted] weeks gestation  Appointments every 4 weeks until 28 weeks  Every 2 weeks from 28 weeks until 36 weeks  Weekly visits from 36 weeks until delivery  How a Baby Grows During Pregnancy  Pregnancy begins when a female's sperm enters a female's egg (fertilization). Fertilization usually happens in one of the tubes (fallopian tubes) that connect the ovaries to the womb (uterus). The fertilized egg moves down the fallopian tube to the uterus. Once it reaches the uterus, it implants into the lining of the uterus and begins to grow. For the first 10 weeks, the fertilized egg is called an embryo. After 10 weeks, it is called a fetus. As the fetus continues to grow, it receives oxygen and nutrients through tissue (placenta) that grows to support the developing baby. The placenta is the life support system for the baby. It provides oxygen and nutrition and removes waste. Learning as much as you can about your pregnancy and how your baby is developing can help you enjoy the experience. It can also make you aware of when there might be a problem and when to ask questions. How long does a typical pregnancy last? A pregnancy usually lasts 280 days, or about 40 weeks. Pregnancy is divided into three periods of growth, also called trimesters:  First trimester: 0-12 weeks.  Second trimester: 13-27 weeks.  Third trimester: 28-40 weeks. The day when your baby is ready to be born (full term) is your estimated date of delivery. How does my baby  develop month by month? First month  The fertilized egg attaches to the inside of the uterus.  Some cells will form the placenta. Others will form the fetus.  The arms, legs, brain, spinal cord, lungs, and heart begin to develop.  At the end of the first month, the heart begins to beat. Second month  The bones, inner ear, eyelids, hands, and feet form.  The genitals develop.  By the end of 8 weeks, all major organs are developing. Third month  All of the internal organs are forming.  Teeth develop below the gums.  Bones and muscles begin to grow. The spine can flex.  The skin is transparent.  Fingernails and toenails begin to form.  Arms and legs continue to grow longer, and hands and feet develop.  The fetus is about 3 inches (7.6 cm) long. Fourth month  The placenta is completely formed.  The external sex organs, neck, outer ear, eyebrows, eyelids, and fingernails are formed.  The fetus can hear, swallow, and move its arms and legs.  The kidneys begin to produce urine.  The skin is covered with a white, waxy coating (vernix) and very fine hair (lanugo). Fifth month  The fetus moves around more and can be felt for the first time (quickening).  The fetus starts to sleep and wake up and may begin to suck its finger.  The nails grow to the end of the fingers.  The organ in the digestive system that makes bile (gallbladder) functions and helps to digest nutrients.  If your baby is a girl, eggs  are present in her ovaries. If your baby is a boy, testicles start to move down into his scrotum. Sixth month  The lungs are formed.  The eyes open. The brain continues to develop.  Your baby has fingerprints and toe prints. Your baby's hair grows thicker.  At the end of the second trimester, the fetus is about 9 inches (22.9 cm) long. Seventh month  The fetus kicks and stretches.  The eyes are developed enough to sense changes in light.  The hands can make a  grasping motion.  The fetus responds to sound. Eighth month  All organs and body systems are fully developed and functioning.  Bones harden, and taste buds develop. The fetus may hiccup.  Certain areas of the brain are still developing. The skull remains soft. Ninth month  The fetus gains about  lb (0.23 kg) each week.  The lungs are fully developed.  Patterns of sleep develop.  The fetus's head typically moves into a head-down position (vertex) in the uterus to prepare for birth.  The fetus weighs 6-9 lb (2.72-4.08 kg) and is 19-20 inches (48.26-50.8 cm) long. What can I do to have a healthy pregnancy and help my baby develop? General instructions  Take prenatal vitamins as directed by your health care provider. These include vitamins such as folic acid, iron, calcium, and vitamin D. They are important for healthy development.  Take medicines only as directed by your health care provider. Read labels and ask a pharmacist or your health care provider whether over-the-counter medicines, supplements, and prescription drugs are safe to take during pregnancy.  Keep all follow-up visits as directed by your health care provider. This is important. Follow-up visits include prenatal care and screening tests. How do I know if my baby is developing well? At each prenatal visit, your health care provider will do several different tests to check on your health and keep track of your baby's development. These include:  Fundal height and position. ? Your health care provider will measure your growing belly from your pubic bone to the top of the uterus using a tape measure. ? Your health care provider will also feel your belly to determine your baby's position.  Heartbeat. ? An ultrasound in the first trimester can confirm pregnancy and show a heartbeat, depending on how far along you are. ? Your health care provider will check your baby's heart rate at every prenatal visit.  Second  trimester ultrasound. ? This ultrasound checks your baby's development. It also may show your baby's gender. What should I do if I have concerns about my baby's development? Always talk with your health care provider about any concerns that you may have about your pregnancy and your baby. Summary  A pregnancy usually lasts 280 days, or about 40 weeks. Pregnancy is divided into three periods of growth, also called trimesters.  Your health care provider will monitor your baby's growth and development throughout your pregnancy.  Follow your health care provider's recommendations about taking prenatal vitamins and medicines during your pregnancy.  Talk with your health care provider if you have any concerns about your pregnancy or your developing baby. This information is not intended to replace advice given to you by your health care provider. Make sure you discuss any questions you have with your health care provider. Document Revised: 01/23/2019 Document Reviewed: 08/15/2017 Elsevier Patient Education  2020 [employer] First Trimester of Pregnancy  The first trimester of pregnancy is from week 1 until the end of week   13 (months 1 through 3). During this time, your baby will begin to develop inside you. At 6-8 weeks, the eyes and face are formed, and the heartbeat can be seen on ultrasound. At the end of 12 weeks, all the baby's organs are formed. Prenatal care is all the medical care you receive before the birth of your baby. Make sure you get good prenatal care and follow all of your doctor's instructions. Follow these instructions at home: Medicines  Take over-the-counter and prescription medicines only as told by your doctor. Some medicines are safe and some medicines are not safe during pregnancy.  Take a prenatal vitamin that contains at least 600 micrograms (mcg) of folic acid.  If you have trouble pooping (constipation), take medicine that will make your stool soft (stool softener)  if your doctor approves. Eating and drinking   Eat regular, healthy meals.  Your doctor will tell you the amount of weight gain that is right for you.  Avoid raw meat and uncooked cheese.  If you feel sick to your stomach (nauseous) or throw up (vomit): ? Eat 4 or 5 small meals a day instead of 3 large meals. ? Try eating a few soda crackers. ? Drink liquids between meals instead of during meals.  To prevent constipation: ? Eat foods that are high in fiber, like fresh fruits and vegetables, whole grains, and beans. ? Drink enough fluids to keep your pee (urine) clear or pale yellow. Activity  Exercise only as told by your doctor. Stop exercising if you have cramps or pain in your lower belly (abdomen) or low back.  Do not exercise if it is too hot, too humid, or if you are in a place of great height (high altitude).  Try to avoid standing for long periods of time. Move your legs often if you must stand in one place for a long time.  Avoid heavy lifting.  Wear low-heeled shoes. Sit and stand up straight.  You can have sex unless your doctor tells you not to. Relieving pain and discomfort  Wear a good support bra if your breasts are sore.  Take warm water baths (sitz baths) to soothe pain or discomfort caused by hemorrhoids. Use hemorrhoid cream if your doctor says it is okay.  Rest with your legs raised if you have leg cramps or low back pain.  If you have puffy, bulging veins (varicose veins) in your legs: ? Wear support hose or compression stockings as told by your doctor. ? Raise (elevate) your feet for 15 minutes, 3-4 times a day. ? Limit salt in your food. Prenatal care  Schedule your prenatal visits by the twelfth week of pregnancy.  Write down your questions. Take them to your prenatal visits.  Keep all your prenatal visits as told by your doctor. This is important. Safety  Wear your seat belt at all times when driving.  Make a list of emergency phone  numbers. The list should include numbers for family, friends, the hospital, and police and fire departments. General instructions  Ask your doctor for a referral to a local prenatal class. Begin classes no later than at the start of month 6 of your pregnancy.  Ask for help if you need counseling or if you need help with nutrition. Your doctor can give you advice or tell you where to go for help.  Do not use hot tubs, steam rooms, or saunas.  Do not douche or use tampons or scented sanitary pads.  Do not cross  your legs for long periods of time.  Avoid all herbs and alcohol. Avoid drugs that are not approved by your doctor.  Do not use any tobacco products, including cigarettes, chewing tobacco, and electronic cigarettes. If you need help quitting, ask your doctor. You may get counseling or other support to help you quit.  Avoid cat litter boxes and soil used by cats. These carry germs that can cause birth defects in the baby and can cause a loss of your baby (miscarriage) or stillbirth.  Visit your dentist. At home, brush your teeth with a soft toothbrush. Be gentle when you floss. Contact a doctor if:  You are dizzy.  You have mild cramps or pressure in your lower belly.  You have a nagging pain in your belly area.  You continue to feel sick to your stomach, you throw up, or you have watery poop (diarrhea).  You have a bad smelling fluid coming from your vagina.  You have pain when you pee (urinate).  You have increased puffiness (swelling) in your face, hands, legs, or ankles. Get help right away if:  You have a fever.  You are leaking fluid from your vagina.  You have spotting or bleeding from your vagina.  You have very bad belly cramping or pain.  You gain or lose weight rapidly.  You throw up blood. It may look like coffee grounds.  You are around people who have [place] measles, fifth disease, or chickenpox.  You have a very bad headache.  You have shortness  of breath.  You have any kind of trauma, such as from a fall or a car accident. Summary  The first trimester of pregnancy is from week 1 until the end of week 13 (months 1 through 3).  To take care of yourself and your unborn baby, you will need to eat healthy meals, take medicines only if your doctor tells you to do so, and do activities that are safe for you and your baby.  Keep all follow-up visits as told by your doctor. This is important as your doctor will have to ensure that your baby is healthy and growing well. This information is not intended to replace advice given to you by your health care provider. Make sure you discuss any questions you have with your health care provider. Document Revised: 01/23/2019 Document Reviewed: 10/10/2016 Elsevier Patient Education  [place]. Common Medications Safe in Pregnancy  Acne:      Constipation:  Benzoyl Peroxide     Colace  Clindamycin      Dulcolax Suppository  Topica Erythromycin     Fibercon  Salicylic Acid      Metamucil         Miralax AVOID:        Senakot   Accutane    Cough:  Retin-A       Cough Drops  Tetracycline      Phenergan w/ Codeine if Rx  Minocycline      Robitussin (Plain & DM)  Antibiotics:     Crabs/Lice:  Ceclor       RID  Cephalosporins    AVOID:  E-Mycins      Kwell  Keflex  Macrobid/Macrodantin   Diarrhea:  Penicillin      Kao-Pectate  Zithromax      Imodium AD         PUSH FLUIDS AVOID:       Cipro     Fever:  Tetracycline  Tylenol (Regular or Extra  Minocycline       Strength)  Levaquin      Extra Strength-Do not          Exceed 8 tabs/24 hrs Caffeine:        '200mg'$ /day (equiv. To 1 cup of coffee or  approx. 3 12 oz sodas)         Gas: Cold/Hayfever:       Gas-X  Benadryl      Mylicon  Claritin       Phazyme  **Claritin-D        Chlor-Trimeton    Headaches:  Dimetapp      ASA-Free Excedrin  Drixoral-Non-Drowsy     Cold Compress  Mucinex (Guaifenasin)     Tylenol (Regular or  Extra  Sudafed/Sudafed-12 Hour     Strength)  **Sudafed PE Pseudoephedrine   Tylenol Cold & Sinus     Vicks Vapor Rub  Zyrtec  **AVOID if Problems With Blood Pressure         Heartburn: Avoid lying down for at least 1 hour after meals  Aciphex      Maalox     Rash:  Milk of Magnesia     Benadryl    Mylanta       1% Hydrocortisone Cream  Pepcid  Pepcid Complete   Sleep Aids:  Prevacid      Ambien   Prilosec       Benadryl  Rolaids       Chamomile Tea  Tums (Limit 4/day)     Unisom         Tylenol PM         Warm milk-add vanilla or  Hemorrhoids:       Sugar for taste  Anusol/Anusol H.C.  (RX: Analapram 2.5%)  Sugar Substitutes:  Hydrocortisone OTC     Ok in moderation  Preparation H      Tucks        Vaseline lotion applied to tissue with wiping    Herpes:     Throat:  Acyclovir      Oragel  Famvir  Valtrex     Vaccines:         Flu Shot Leg Cramps:       *Gardasil  Benadryl      Hepatitis A         Hepatitis B Nasal Spray:       Pneumovax  Saline Nasal Spray     Polio Booster         Tetanus Nausea:       Tuberculosis test or PPD  Vitamin B6 25 mg TID   AVOID:    Dramamine      *Gardasil  Emetrol       Live Poliovirus  Ginger Root 250 mg QID    MMR (measles, mumps &  High Complex Carbs @ Bedtime    rebella)  Sea Bands-Accupressure    Varicella (Chickenpox)  Unisom 1/2 tab TID     *No known complications           If received before Pain:         Known pregnancy;   Darvocet       Resume series after  Lortab        Delivery  Percocet    Yeast:   Tramadol      Femstat  Tylenol 3      Gyne-lotrimin  Ultram       Monistat  Vicodin           MISC:         All Sunscreens           Hair Coloring/highlights          Insect Repellant's          (Including DEET)         Mystic Tans

## 2020-09-21 NOTE — Progress Notes (Signed)
/  Pt presents for NOB nurse interview visit. Pregnancy confirmation done 08/31/20 by Dr. Wardell Heath 1 P 0   . Pregnancy education material explained and given. 1 cat in the home. NOB labs ordered. (TSH/HbgA1c due to increased BMI), (sickle cell). HIV labs and Drug screen were explained optional and she did not decline. Drug screen ordered/declined. PNV encouraged. Pt is taking PNV. Genetic screening options discussed . Genetic testing: Desired. To be ordered at NOB physical. Pt. To follow up with provider in 5/6 weeks for NOB physical. Financial policy reviewed. FMLA paperwork policy reviewed and signed. All questions answered.

## 2020-09-22 ENCOUNTER — Other Ambulatory Visit: Payer: 59

## 2020-09-22 ENCOUNTER — Encounter: Payer: Self-pay | Admitting: Obstetrics and Gynecology

## 2020-09-22 LAB — URINALYSIS, ROUTINE W REFLEX MICROSCOPIC
Bilirubin, UA: NEGATIVE
Glucose, UA: NEGATIVE
Ketones, UA: NEGATIVE
Leukocytes,UA: NEGATIVE
Nitrite, UA: NEGATIVE
Protein,UA: NEGATIVE
RBC, UA: NEGATIVE
Specific Gravity, UA: 1.006 (ref 1.005–1.030)
Urobilinogen, Ur: 0.2 mg/dL (ref 0.2–1.0)
pH, UA: 6.5 (ref 5.0–7.5)

## 2020-09-23 LAB — RPR, QUANT+TP ABS (REFLEX)
Rapid Plasma Reagin, Quant: 1:1 {titer} — ABNORMAL HIGH
T Pallidum Abs: NONREACTIVE

## 2020-09-23 LAB — VARICELLA ZOSTER ANTIBODY, IGG: Varicella zoster IgG: 1718 {index}

## 2020-09-23 LAB — TOXOPLASMA ANTIBODIES- IGG AND  IGM
Toxoplasma Antibody- IgM: <3 AU/mL (ref 0.0–7.9)
Toxoplasma IgG Ratio: <3 IU/mL (ref 0.0–7.1)

## 2020-09-23 LAB — HIV ANTIBODY (ROUTINE TESTING W REFLEX): HIV Screen 4th Generation wRfx: NONREACTIVE

## 2020-09-23 LAB — ABO AND RH: Rh Factor: POSITIVE

## 2020-09-23 LAB — URINE CULTURE, OB REFLEX

## 2020-09-23 LAB — RUBELLA SCREEN: Rubella Antibodies, IGG: 11.4 index (ref >0.99)

## 2020-09-23 LAB — HEPATITIS B SURFACE ANTIGEN: Hepatitis B Surface Ag: NEGATIVE

## 2020-09-23 LAB — SYPHILIS: RPR W/REFLEX TO RPR TITER AND TREPONEMAL ANTIBODIES, TRADITIONAL SCREENING AND DIAGNOSIS ALGORITHM: RPR Ser Ql: REACTIVE — AB

## 2020-09-23 LAB — CULTURE, OB URINE

## 2020-09-23 LAB — ANTIBODY SCREEN: Antibody Screen: NEGATIVE

## 2020-09-23 LAB — HEPATITIS C ANTIBODY: Hep C Virus Ab: <0.1 s/co ratio (ref 0.0–0.9)

## 2020-09-24 LAB — MONITOR DRUG PROFILE 14(MW)
Amphetamine Scrn, Ur: NEGATIVE ng/mL
BARBITURATE SCREEN URINE: NEGATIVE ng/mL
BENZODIAZEPINE SCREEN, URINE: NEGATIVE ng/mL
Buprenorphine, Urine: NEGATIVE ng/mL
CANNABINOIDS UR QL SCN: NEGATIVE ng/mL
Cocaine (Metab) Scrn, Ur: NEGATIVE ng/mL
Creatinine(Crt), U: 23.4 mg/dL (ref 20.0–300.0)
Fentanyl, Urine: NEGATIVE pg/mL
Meperidine Screen, Urine: NEGATIVE ng/mL
Methadone Screen, Urine: NEGATIVE ng/mL
OXYCODONE+OXYMORPHONE UR QL SCN: NEGATIVE ng/mL
Opiate Scrn, Ur: NEGATIVE ng/mL
Ph of Urine: 6.3 (ref 4.5–8.9)
Phencyclidine Qn, Ur: NEGATIVE ng/mL
Propoxyphene Scrn, Ur: NEGATIVE ng/mL
SPECIFIC GRAVITY: 1.006
Tramadol Screen, Urine: NEGATIVE ng/mL

## 2020-09-25 LAB — GC/CHLAMYDIA PROBE AMP
Chlamydia trachomatis, NAA: NEGATIVE
Neisseria Gonorrhoeae by PCR: NEGATIVE

## 2020-10-16 NOTE — L&D Delivery Note (Signed)
Delivery Summary for Renee Hart  Labor Events:   Preterm labor: No data found  Rupture date: 05/05/2021  Rupture time: 11:00 PM  Rupture type: Spontaneous Possible ROM - for evaluation  Fluid Color: Clear  Induction: No data found  Augmentation: No data found  Complications: No data found  Cervical ripening: No data found No data found   No data found     Delivery:   Episiotomy: No data found  Lacerations: No data found  Repair suture: No data found  Repair # of packets: No data found  Blood loss (ml): 526   Information for the patient's newborn:  Renee Hart, Renee Hart [id number]   Delivery 05/07/2021 1:33 AM by  C-Section, Low Transverse Sex:  female Gestational Age: 16w5dDelivery Clinician:   Living?:         APGARS  One minute Five minutes Ten minutes  Skin color:        Heart rate:        Grimace:        Muscle tone:        Breathing:        Totals: 7  9      Presentation/position:      Resuscitation:   Cord information:    Disposition of cord blood:     Blood gases sent?  Complications:   Placenta: Delivered:       appearance Newborn Measurements: Weight: 9 lb 4.5 oz (4210 g)  Height: 21.5"  Head circumference:    Chest circumference:    Other providers:    Additional  information: Forceps:   Vacuum:   Breech:   Observed anomalies       See Dr. CAndreas BlowerC-section operative note for details of procedure.    Renee Maid MD [facility]

## 2020-10-22 NOTE — Patient Instructions (Addendum)
WHAT OB PATIENTS CAN EXPECT   Confirmation of pregnancy and ultrasound ordered if medically indicated-[redacted] weeks gestation  New OB (NOB) intake with nurse and New OB (NOB) labs- [redacted] weeks gestation  New OB (NOB) physical examination with provider- 11/[redacted] weeks gestation  Flu vaccine-[redacted] weeks gestation  Anatomy scan-[redacted] weeks gestation  Glucose tolerance test, blood work to test for anemia, T-dap vaccine-[redacted] weeks gestation  Vaginal swabs/cultures-STD/Group B strep-[redacted] weeks gestation  Appointments every 4 weeks until 28 weeks  Every 2 weeks from 28 weeks until 36 weeks  Weekly visits from 36 weeks until delivery  Second Trimester of Pregnancy  The second trimester is from week 14 through week 27 (month 4 through 6). This is often the time in pregnancy that you feel your best. Often times, morning sickness has lessened or quit. You may have more energy, and you may get hungry more often. Your unborn baby is growing rapidly. At the end of the sixth month, he or she is about 9 inches long and weighs about 1 pounds. You will likely feel the baby move between 18 and 20 weeks of pregnancy. Follow these instructions at home: Medicines  Take over-the-counter and prescription medicines only as told by your doctor. Some medicines are safe and some medicines are not safe during pregnancy.  Take a prenatal vitamin that contains at least 600 micrograms (mcg) of folic acid.  If you have trouble pooping (constipation), take medicine that will make your stool soft (stool softener) if your doctor approves. Eating and drinking   Eat regular, healthy meals.  Avoid raw meat and uncooked cheese.  If you get low calcium from the food you eat, talk to your doctor about taking a daily calcium supplement.  Avoid foods that are high in fat and sugars, such as fried and sweet foods.  If you feel sick to your stomach (nauseous) or throw up (vomit): ? Eat 4 or 5 small meals a day instead of 3 large  meals. ? Try eating a few soda crackers. ? Drink liquids between meals instead of during meals.  To prevent constipation: ? Eat foods that are high in fiber, like fresh fruits and vegetables, whole grains, and beans. ? Drink enough fluids to keep your pee (urine) clear or pale yellow. Activity  Exercise only as told by your doctor. Stop exercising if you start to have cramps.  Do not exercise if it is too hot, too humid, or if you are in a place of great height (high altitude).  Avoid heavy lifting.  Wear low-heeled shoes. Sit and stand up straight.  You can continue to have sex unless your doctor tells you not to. Relieving pain and discomfort  Wear a good support bra if your breasts are tender.  Take warm water baths (sitz baths) to soothe pain or discomfort caused by hemorrhoids. Use hemorrhoid cream if your doctor approves.  Rest with your legs raised if you have leg cramps or low back pain.  If you develop puffy, bulging veins (varicose veins) in your legs: ? Wear support hose or compression stockings as told by your doctor. ? Raise (elevate) your feet for 15 minutes, 3-4 times a day. ? Limit salt in your food. Prenatal care  Write down your questions. Take them to your prenatal visits.  Keep all your prenatal visits as told by your doctor. This is important. Safety  Wear your seat belt when driving.  Make a list of emergency phone numbers, including numbers for family, friends, the   hospital, and police and fire departments. General instructions  Ask your doctor about the right foods to eat or for help finding a counselor, if you need these services.  Ask your doctor about local prenatal classes. Begin classes before month 6 of your pregnancy.  Do not use hot tubs, steam rooms, or saunas.  Do not douche or use tampons or scented sanitary pads.  Do not cross your legs for long periods of time.  Visit your dentist if you have not done so. Use a soft toothbrush  to brush your teeth. Floss gently.  Avoid all smoking, herbs, and alcohol. Avoid drugs that are not approved by your doctor.  Do not use any products that contain nicotine or tobacco, such as cigarettes and e-cigarettes. If you need help quitting, ask your doctor.  Avoid cat litter boxes and soil used by cats. These carry germs that can cause birth defects in the baby and can cause a loss of your baby (miscarriage) or stillbirth. Contact a doctor if:  You have mild cramps or pressure in your lower belly.  You have pain when you pee (urinate).  You have bad smelling fluid coming from your vagina.  You continue to feel sick to your stomach (nauseous), throw up (vomit), or have watery poop (diarrhea).  You have a nagging pain in your belly area.  You feel dizzy. Get help right away if:  You have a fever.  You are leaking fluid from your vagina.  You have spotting or bleeding from your vagina.  You have severe belly cramping or pain.  You lose or gain weight rapidly.  You have trouble catching your breath and have chest pain.  You notice sudden or extreme puffiness (swelling) of your face, hands, ankles, feet, or legs.  You have not felt the baby move in over an hour.  You have severe headaches that do not go away when you take medicine.  You have trouble seeing. Summary  The second trimester is from week 14 through week 27 (months 4 through 6). This is often the time in pregnancy that you feel your best.  To take care of yourself and your unborn baby, you will need to eat healthy meals, take medicines only if your doctor tells you to do so, and do activities that are safe for you and your baby.  Call your doctor if you get sick or if you notice anything unusual about your pregnancy. Also, call your doctor if you need help with the right food to eat, or if you want to know what activities are safe for you. This information is not intended to replace advice given to you by  your health care provider. Make sure you discuss any questions you have with your health care provider. Document Revised: 01/24/2019 Document Reviewed: 11/07/2016 Elsevier Patient Education  2020 [employer]. Commonly Asked Questions During Pregnancy  Cats: A parasite can be excreted in cat feces.  To avoid exposure you need to have another person empty the little box.  If you must empty the litter box you will need to wear gloves.  Wash your hands after handling your cat.  This parasite can also be found in raw or undercooked meat so this should also be avoided.  Colds, Sore Throats, Flu: Please check your medication sheet to see what you can take for symptoms.  If your symptoms are unrelieved by these medications please call the office.  Dental Work: Most any dental work Investment banker, corporate recommends is permitted.  X-rays should only be taken during the first trimester if absolutely necessary.  Your abdomen should be shielded with a lead apron during all x-rays.  Please notify your provider prior to receiving any x-rays.  Novocaine is fine; gas is not recommended.  If your dentist requires a note from [place] prior to dental work please call the office and we will provide one for you.  Exercise: Exercise is an important part of staying healthy during your pregnancy.  You may continue most exercises you were accustomed to prior to pregnancy.  Later in your pregnancy you will most likely notice you have difficulty with activities requiring balance like riding a bicycle.  It is important that you listen to your body and avoid activities that put you at a higher risk of falling.  Adequate rest and staying well hydrated are a must!  If you have questions about the safety of specific activities ask your provider.    Exposure to Children with illness: Try to avoid obvious exposure; report any symptoms to [place] when noted,  If you have chicken pos, red measles or mumps, you should be immune to these diseases.   Please do  not take any vaccines while pregnant unless you have checked with your OB provider.  Fetal Movement: After 28 weeks we recommend you do "kick counts" twice daily.  Lie or sit down in a calm quiet environment and count your baby movements "kicks".  You should feel your baby at least 10 times per hour.  If you have not felt 10 kicks within the first hour get up, walk around and have something sweet to eat or drink then repeat for an additional hour.  If count remains less than 10 per hour notify your provider.  Fumigating: Follow your pest control agent's advice as to how long to stay out of your home.  Ventilate the area well before re-entering.  Hemorrhoids:   Most over-the-counter preparations can be used during pregnancy.  Check your medication to see what is safe to use.  It is important to use a stool softener or fiber in your diet and to drink lots of liquids.  If hemorrhoids seem to be getting worse please call the office.   Hot Tubs:  Hot tubs Jacuzzis and saunas are not recommended while pregnant.  These increase your internal body temperature and should be avoided.  Intercourse:  Sexual intercourse is safe during pregnancy as long as you are comfortable, unless otherwise advised by your provider.  Spotting may occur after intercourse; report any bright red bleeding that is heavier than spotting.  Labor:  If you know that you are in labor, please go to the hospital.  If you are unsure, please call the office and let us help you decide what to do.  Lifting, straining, etc:  If your job requires heavy lifting or straining please check with your provider for any limitations.  Generally, you should not lift items heavier than that you can lift simply with your hands and arms (no back muscles)  Painting:  Paint fumes do not harm your pregnancy, but may make you ill and should be avoided if possible.  Latex or water based paints have less odor than oils.  Use adequate ventilation while  painting.  Permanents & Hair Color:  Chemicals in hair dyes are not recommended as they cause increase hair dryness which can increase hair loss during pregnancy.  " Highlighting" and permanents are allowed.  Dye may be absorbed differently and permanents  may not hold as well during pregnancy.  Sunbathing:  Use a sunscreen, as skin burns easily during pregnancy.  Drink plenty of fluids; avoid over heating.  Tanning Beds:  Because their possible side effects are still unknown, tanning beds are not recommended.  Ultrasound Scans:  Routine ultrasounds are performed at approximately 20 weeks.  You will be able to see your baby's general anatomy an if you would like to know the gender this can usually be determined as well.  If it is questionable when you conceived you may also receive an ultrasound early in your pregnancy for dating purposes.  Otherwise ultrasound exams are not routinely performed unless there is a medical necessity.  Although you can request a scan we ask that you pay for it when conducted because insurance does not cover " patient request" scans.  Work: If your pregnancy proceeds without complications you may work until your due date, unless your physician or employer advises otherwise.  Round Ligament Pain/Pelvic Discomfort:  Sharp, shooting pains not associated with bleeding are fairly common, usually occurring in the second trimester of pregnancy.  They tend to be worse when standing up or when you remain standing for long periods of time.  These are the result of pressure of certain pelvic ligaments called "round ligaments".  Rest, Tylenol and heat seem to be the most effective relief.  As the womb and fetus grow, they rise out of the pelvis and the discomfort improves.  Please notify the office if your pain seems different than that described.  It may represent a more serious condition.  Common Medications Safe in Pregnancy  Acne:      Constipation:  Benzoyl  Peroxide     Colace  Clindamycin      Dulcolax Suppository  Topica Erythromycin     Fibercon  Salicylic Acid      Metamucil         Miralax AVOID:        Senakot   Accutane    Cough:  Retin-A       Cough Drops  Tetracycline      Phenergan w/ Codeine if Rx  Minocycline      Robitussin (Plain & DM)  Antibiotics:     Crabs/Lice:  Ceclor       RID  Cephalosporins    AVOID:  E-Mycins      Kwell  Keflex  Macrobid/Macrodantin   Diarrhea:  Penicillin      Kao-Pectate  Zithromax      Imodium AD         PUSH FLUIDS AVOID:       Cipro     Fever:  Tetracycline      Tylenol (Regular or Extra  Minocycline       Strength)  Levaquin      Extra Strength-Do not          Exceed 8 tabs/24 hrs Caffeine:        '200mg'$ /day (equiv. To 1 cup of coffee or  approx. 3 12 oz sodas)         Gas: Cold/Hayfever:       Gas-X  Benadryl      Mylicon  Claritin       Phazyme  **Claritin-D        Chlor-Trimeton    Headaches:  Dimetapp      ASA-Free Excedrin  Drixoral-Non-Drowsy     Cold Compress  Mucinex (Guaifenasin)     Tylenol (Regular or Extra  Sudafed/Sudafed-12 Hour  Strength)  **Sudafed PE Pseudoephedrine   Tylenol Cold & Sinus     Vicks Vapor Rub  Zyrtec  **AVOID if Problems With Blood Pressure         Heartburn: Avoid lying down for at least 1 hour after meals  Aciphex      Maalox     Rash:  Milk of Magnesia     Benadryl    Mylanta       1% Hydrocortisone Cream  Pepcid  Pepcid Complete   Sleep Aids:  Prevacid      Ambien   Prilosec       Benadryl  Rolaids       Chamomile Tea  Tums (Limit 4/day)     Unisom         Tylenol PM         Warm milk-add vanilla or  Hemorrhoids:       Sugar for taste  Anusol/Anusol H.C.  (RX: Analapram 2.5%)  Sugar Substitutes:  Hydrocortisone OTC     Ok in moderation  Preparation H      Tucks        Vaseline lotion applied to tissue with wiping    Herpes:     Throat:  Acyclovir      Oragel  Famvir  Valtrex     Vaccines:         Flu  Shot Leg Cramps:       *Gardasil  Benadryl      Hepatitis A         Hepatitis B Nasal Spray:       Pneumovax  Saline Nasal Spray     Polio Booster         Tetanus Nausea:       Tuberculosis test or PPD  Vitamin B6 25 mg TID   AVOID:    Dramamine      *Gardasil  Emetrol       Live Poliovirus  Ginger Root 250 mg QID    MMR (measles, mumps &  High Complex Carbs @ Bedtime    rebella)  Sea Bands-Accupressure    Varicella (Chickenpox)  Unisom 1/2 tab TID     *No known complications           If received before Pain:         Known pregnancy;   Darvocet       Resume series after  Lortab        Delivery  Percocet    Yeast:   Tramadol      Femstat  Tylenol 3      Gyne-lotrimin  Ultram       Monistat  Vicodin           MISC:         All Sunscreens           Hair Coloring/highlights          Insect Repellant's          (Including DEET)         Mystic Tans  Second Trimester of Pregnancy  The second trimester of pregnancy is from week 13 through week 27. This is months 4 through 6 of pregnancy. The second trimester is often a time when you feel your best. Your body has adjusted to being pregnant, and you begin to feel better physically. During the second trimester:  Morning sickness has lessened or stopped completely.  You may have more energy.  You may have an  increase in appetite. The second trimester is also a time when the unborn baby (fetus) is growing rapidly. At the end of the sixth month, the fetus may be up to 12 inches long and weigh about 1 pounds. You will likely begin to feel the baby move (quickening) between 16 and 20 weeks of pregnancy. Body changes during your second trimester Your body continues to go through many changes during your second trimester. The changes vary and generally return to normal after the baby is born. Physical changes  Your weight will continue to increase. You will notice your lower abdomen bulging out.  You may begin to get stretch marks on  your hips, abdomen, and breasts.  Your breasts will continue to grow and to become tender.  Dark spots or blotches (chloasma or mask of pregnancy) may develop on your face.  A dark line from your belly button to the pubic area (linea nigra) may appear.  You may have changes in your hair. These can include thickening of your hair, rapid growth, and changes in texture. Some people also have hair loss during or after pregnancy, or hair that feels dry or thin. Health changes  You may develop headaches.  You may have heartburn.  You may develop constipation.  You may develop hemorrhoids or swollen, bulging veins (varicose veins).  Your gums may bleed and may be sensitive to brushing and flossing.  You may urinate more often because the fetus is pressing on your bladder.  You may have back pain. This is caused by: ? Weight gain. ? Pregnancy hormones that are relaxing the joints in your pelvis. ? A shift in weight and the muscles that support your balance. Follow these instructions at home: Medicines  Follow your health care provider's instructions regarding medicine use. Specific medicines may be either safe or unsafe to take during pregnancy. Do not take any medicines unless approved by your health care provider.  Take a prenatal vitamin that contains at least 600 micrograms (mcg) of folic acid. Eating and drinking  Eat a healthy diet that includes fresh fruits and vegetables, whole grains, good sources of protein such as meat, eggs, or tofu, and low-fat dairy products.  Avoid raw meat and unpasteurized juice, milk, and cheese. These carry germs that can harm you and your baby.  You may need to take these actions to prevent or treat constipation: ? Drink enough fluid to keep your urine pale yellow. ? Eat foods that are high in fiber, such as beans, whole grains, and fresh fruits and vegetables. ? Limit foods that are high in fat and processed sugars, such as fried or sweet  foods. Activity  Exercise only as directed by your health care provider. Most people can continue their usual exercise routine during pregnancy. Try to exercise for 30 minutes at least 5 days a week. Stop exercising if you develop contractions in your uterus.  Stop exercising if you develop pain or cramping in the lower abdomen or lower back.  Avoid exercising if it is very hot or humid or if you are at a high altitude.  Avoid heavy lifting.  If you choose to, you may have sex unless your health care provider tells you not to. Relieving pain and discomfort  Wear a supportive bra to prevent discomfort from breast tenderness.  Take warm sitz baths to soothe any pain or discomfort caused by hemorrhoids. Use hemorrhoid cream if your health care provider approves.  Rest with your legs raised (elevated) if you have  leg cramps or low back pain.  If you develop varicose veins: ? Wear support hose as told by your health care provider. ? Elevate your feet for 15 minutes, 3-4 times a day. ? Limit salt in your diet. Safety  Wear your seat belt at all times when driving or riding in a car.  Talk with your health care provider if someone is verbally or physically abusive to you. Lifestyle  Do not use hot tubs, steam rooms, or saunas.  Do not douche. Do not use tampons or scented sanitary pads.  Avoid cat litter boxes and soil used by cats. These carry germs that can cause birth defects in the baby and possibly loss of the fetus by miscarriage or stillbirth.  Do not use herbal remedies, alcohol, illegal drugs, or medicines that are not approved by your health care provider. Chemicals in these products can harm your baby.  Do not use any products that contain nicotine or tobacco, such as cigarettes, e-cigarettes, and chewing tobacco. If you need help quitting, ask your health care provider. General instructions  During a routine prenatal visit, your health care provider will do a physical  exam and other tests. He or she will also discuss your overall health. Keep all follow-up visits. This is important.  Ask your health care provider for a referral to a local prenatal education class.  Ask for help if you have counseling or nutritional needs during pregnancy. Your health care provider can offer advice or refer you to specialists for help with various needs. Where to find more information  American Pregnancy Association: americanpregnancy.White and Gynecologists: PoolDevices.com.pt  Office on [employer]: KeywordPortfolios.com.br Contact a health care provider if you have:  A headache that does not go away when you take medicine.  Vision changes or you see spots in front of your eyes.  Mild pelvic cramps, pelvic pressure, or nagging pain in the abdominal area.  Persistent nausea, vomiting, or diarrhea.  A bad-smelling vaginal discharge or foul-smelling urine.  Pain when you urinate.  Sudden or extreme swelling of your face, hands, ankles, feet, or legs.  A fever. Get help right away if you:  Have fluid leaking from your vagina.  Have spotting or bleeding from your vagina.  Have severe abdominal cramping or pain.  Have difficulty breathing.  Have chest pain.  Have fainting spells.  Have not felt your baby move for the time period told by your health care provider.  Have new or increased pain, swelling, or redness in an arm or leg. Summary  The second trimester of pregnancy is from week 13 through week 27 (months 4 through 6).  Do not use herbal remedies, alcohol, illegal drugs, or medicines that are not approved by your health care provider. Chemicals in these products can harm your baby.  Exercise only as directed by your health care provider. Most people can continue their usual exercise routine during pregnancy.  Keep all follow-up visits. This is important. This information is not  intended to replace advice given to you by your health care provider. Make sure you discuss any questions you have with your health care provider. Document Revised: 03/10/2020 Document Reviewed: 01/15/2020 Elsevier Patient Education  2021 [place].    Morning Sickness  Morning sickness is when you feel like you may vomit (feel nauseous) during pregnancy. Sometimes, you may vomit. Morning sickness most often happens in the morning, but it can also happen at any time of the day. Some  women may have morning sickness that makes them vomit all the time. This is a more serious problem that needs treatment. What are the causes? The cause of this condition is not known. What increases the risk?  You had vomiting or a feeling like you may vomit before your pregnancy.  You had morning sickness in another pregnancy.  You are pregnant with more than one baby, such as twins. What are the signs or symptoms?  Feeling like you may vomit.  Vomiting. How is this treated? Treatment is usually not needed for this condition. You may only need to change what you eat. In some cases, your doctor may give you some things to take for your condition. These include:  Vitamin B6 supplements.  Medicines to treat the feeling that you may vomit.  Ginger. Follow these instructions at home: Medicines  Take over-the-counter and prescription medicines only as told by your doctor. Do not take any medicines until you talk with your doctor about them first.  Take multivitamins before you get pregnant. These can stop or lessen the symptoms of morning sickness. Eating and drinking  Eat dry toast or crackers before getting out of bed.  Eat 5 or 6 small meals a day.  Eat dry and bland foods like rice and baked potatoes.  Do not eat greasy, fatty, or spicy foods.  Have someone cook for you if the smell of food causes you to vomit or to feel like you may vomit.  If you feel like you may vomit after taking  prenatal vitamins, take them at night or with a snack.  Eat protein foods when you need a snack. Nuts, yogurt, and cheese are good choices.  Drink fluids throughout the day.  Try ginger ale made with real ginger, ginger tea made from fresh grated ginger, or ginger candies. General instructions  Do not smoke or use any products that contain nicotine or tobacco. If you need help quitting, ask your doctor.  Use an air purifier to keep the air in your house free of smells.  Get lots of fresh air.  Try to avoid smells that make you feel sick.  Try wearing an acupressure wristband. This is a wristband that is used to treat seasickness.  Try a treatment called acupuncture. In this treatment, a doctor puts needles into certain areas of your body to make you feel better. Contact a doctor if:  You need medicine to feel better.  You feel dizzy or light-headed.  You are losing weight. Get help right away if:  The feeling that you may vomit will not go away, or you cannot stop vomiting.  You faint.  You have very bad pain in your belly. Summary  Morning sickness is when you feel like you may vomit (feel nauseous) during pregnancy.  You may feel sick in the morning, but you can feel this way at any time of the day.  Making some changes to what you eat may help your symptoms go away. This information is not intended to replace advice given to you by your health care provider. Make sure you discuss any questions you have with your health care provider. Document Revised: 05/17/2020 Document Reviewed: 04/26/2020 Elsevier Patient Education  2021 [employer].

## 2020-10-26 ENCOUNTER — Encounter: Payer: Self-pay | Admitting: Obstetrics and Gynecology

## 2020-10-26 ENCOUNTER — Other Ambulatory Visit: Payer: Self-pay

## 2020-10-26 ENCOUNTER — Other Ambulatory Visit: Payer: Self-pay | Admitting: Obstetrics and Gynecology

## 2020-10-26 ENCOUNTER — Ambulatory Visit (INDEPENDENT_AMBULATORY_CARE_PROVIDER_SITE_OTHER): Payer: 59 | Admitting: Obstetrics and Gynecology

## 2020-10-26 VITALS — BP 132/87 | HR 92 | Ht 64.0 in | Wt 130.4 lb

## 2020-10-26 DIAGNOSIS — Z3401 Encounter for supervision of normal first pregnancy, first trimester: Secondary | ICD-10-CM | POA: Diagnosis not present

## 2020-10-26 DIAGNOSIS — O219 Vomiting of pregnancy, unspecified: Secondary | ICD-10-CM

## 2020-10-26 DIAGNOSIS — Z3A11 11 weeks gestation of pregnancy: Secondary | ICD-10-CM

## 2020-10-26 DIAGNOSIS — Z8616 Personal history of COVID-19: Secondary | ICD-10-CM

## 2020-10-26 DIAGNOSIS — Z3481 Encounter for supervision of other normal pregnancy, first trimester: Secondary | ICD-10-CM | POA: Diagnosis not present

## 2020-10-26 LAB — POCT URINALYSIS DIPSTICK OB
Bilirubin, UA: NEGATIVE
Blood, UA: NEGATIVE
Glucose, UA: NEGATIVE
Ketones, UA: NEGATIVE
Nitrite, UA: NEGATIVE
POC,PROTEIN,UA: NEGATIVE
Spec Grav, UA: 1.01 (ref 1.010–1.025)
Urobilinogen, UA: 0.2 E.U./dL
pH, UA: 7 (ref 5.0–8.0)

## 2020-10-26 MED ORDER — DOXYLAMINE-PYRIDOXINE 10-10 MG PO TBEC: 2.0000 | DELAYED_RELEASE_TABLET | Freq: Every day | ORAL | 5 refills | Status: DC

## 2020-10-26 NOTE — Progress Notes (Signed)
OB-Pt present for initial prenatal care. Pt stated that she was doing well and denies any issues at this time.

## 2020-10-26 NOTE — Progress Notes (Signed)
Physician Attestation:   I have seen and examined the patient with Deitra Mayo, Student - MidWife.  I have reviewed the record and concur with patient management and plan.   Rubie Maid, MD [facility]

## 2020-10-26 NOTE — Progress Notes (Signed)
ANNUAL PREVENTATIVE CARE GYN  ENCOUNTER NOTE  Subjective:       Renee Hart is a 30 y.o. G1P0000 female 11 weeks 2 days here for a New OB exam.  Current complaints:   Nausea that is worse at night, using home remedies with moderate relief.   Patient reports having COVID 3 weeks ago and has a lingering dry cough.  Reports breast slightly tender to touch  Denies difficulty breathing, respiratory distress, chest pain, vaginal bleeding, cramping, and leg swelling or pain.   Gynecologic History Patient's last menstrual period was 07/28/2020. Contraception: none, Pregnancy Last Pap: 03/26/2018. Results were: normal  Obstetric History OB History  Gravida Para Term Preterm AB Living  1 0 0 0 0 0  SAB IAB Ectopic Multiple Live Births  0 0 0 0 0    # Outcome Date GA Lbr Len/2nd Weight Sex Delivery Anes PTL Lv  1 Current             Past Medical History:  Diagnosis Date   Medical history non-contributory     Past Surgical History:  Procedure Laterality Date   KNEE ARTHROSCOPY WITH MEDIAL MENISECTOMY Right 08/28/2018   Procedure: KNEE ARTHROSCOPY WITH MEDIAL OR LATERAL MENISECTOMY;  Surgeon: Thornton Park, MD;  Location: [facility] ORS;  Service: Orthopedics;  Laterality: Right;   REFRACTIVE SURGERY      Current Outpatient Medications on File Prior to Visit  Medication Sig Dispense Refill   Prenatal Vit-Fe Fumarate-FA (MULTIVITAMIN-PRENATAL) 27-0.8 MG TABS tablet Take 1 tablet by mouth daily at 12 noon.     No current facility-administered medications on file prior to visit.    No Known Allergies  Social History   Socioeconomic History   Marital status: Married    Spouse name: Not on file   Number of children: Not on file   Years of education: Not on file   Highest education level: Not on file  Occupational History   Not on file  Tobacco Use   Smoking status: Never Smoker   Smokeless tobacco: Never Used  Vaping Use   Vaping Use: Never used   Substance and Sexual Activity   Alcohol use: Not Currently    Alcohol/week: 0.0 standard drinks    Comment: socially   Drug use: Never   Sexual activity: Yes    Birth control/protection: None  Other Topics Concern   Not on file  Social History Narrative   Not on file   Social Determinants of Health   Financial Resource Strain: Not on file  Food Insecurity: Not on file  Transportation Needs: Not on file  Physical Activity: Not on file  Stress: Not on file  Social Connections: Not on file  Intimate Partner Violence: Not on file    Family History  Problem Relation Age of Onset   Ovarian cancer Maternal Grandmother    Healthy Mother    Healthy Father     The following portions of the patient's history were reviewed and updated as appropriate: allergies, current medications, past family history, past medical history, past social history, past surgical history and problem list.  Review of Systems  ROS- Negative other than what was reported above. Information obtained verbally from patient.  Objective:   BP 132/87    Pulse 92    Ht 5\' 4"  (1.626 m)    Wt 59.1 kg    LMP 07/28/2020    BMI 22.38 kg/m    CONSTITUTIONAL: Well-developed, well-nourished female in no acute distress.   PSYCHIATRIC:  Normal mood and affect. Normal behavior. Normal judgment and thought content.  Farmer: Alert and oriented to person, place, and time. Normal muscle tone coordination. No cranial nerve deficit noted.  EYES: Conjunctivae and EOM are normal. Pupils are equal, round, and reactive to light. No scleral icterus.   NECK: Normal range of motion, supple, no masses.  Normal thyroid.   SKIN: Skin is warm and dry. No rash noted. Not diaphoretic. No erythema. No pallor.  CARDIOVASCULAR: Normal heart rate noted, regular rhythm, no murmur.  RESPIRATORY: Clear to auscultation bilaterally. Effort and breath sounds normal, no problems with respiration noted.  BREASTS: Symmetric in size.  No masses, skin changes, nipple drainage, or lymphadenopathy.   ABDOMEN: Soft, normal bowel sounds, no distention noted.  No tenderness, rebound or guarding. 11 week uterus noted. FHT 162 bpm.  PELVIC: Patient defered until 3rd trimester.   MUSCULOSKELETAL: Normal range of motion. No tenderness.  No cyanosis, clubbing, or edema.   Assessment:   Supervision of pregnancy at [redacted] weeks gestation Normal BMI  Nausea and vomiting in pregnancy History of COVID infection  Plan:  Pap: Not done. Up to date. Labs: Prenatal labs reviewed.  Genetic screening discussed, plans for NIPT testing with [facility].  Routine preventative health maintenance measures emphasized: Exercise/Diet/Weight control, Tobacco Warnings, Alcohol/Substance use risks, Stress Management and Peer Pressure Issues Discussed OTC and self-help measures for nausea and vomiting and post-COVID symptoms. Also prescribed Diclegis for nausea/vomiting. Anticipatory guidance regarding course of prenatal care. Reviewed red flag symptoms and when to call. ROB x 4 weeks with Dr. Amalia Hailey or sooner if needed.   Daneil Dan, [place] 10/26/20 10:58 AM

## 2020-10-27 LAB — CBC
Hematocrit: 38.8 % (ref 34.0–46.6)
Hemoglobin: 13.5 g/dL (ref 11.1–15.9)
MCH: 32.8 pg (ref 26.6–33.0)
MCHC: 34.8 g/dL (ref 31.5–35.7)
MCV: 94 fL (ref 79–97)
Platelets: 230 10*3/uL (ref 150–450)
RBC: 4.11 x10E6/uL (ref 3.77–5.28)
RDW: 11.8 % (ref 11.7–15.4)
WBC: 9.1 10*3/uL (ref 3.4–10.8)

## 2020-11-04 NOTE — Telephone Encounter (Signed)
Please advise. Thanks Toshiyuki Fredell 

## 2020-11-24 ENCOUNTER — Other Ambulatory Visit: Payer: Self-pay

## 2020-11-24 ENCOUNTER — Ambulatory Visit (INDEPENDENT_AMBULATORY_CARE_PROVIDER_SITE_OTHER): Payer: 59 | Admitting: Obstetrics and Gynecology

## 2020-11-24 ENCOUNTER — Encounter: Payer: Self-pay | Admitting: Obstetrics and Gynecology

## 2020-11-24 VITALS — BP 117/81 | HR 90 | Wt 130.4 lb

## 2020-11-24 DIAGNOSIS — Z3A15 15 weeks gestation of pregnancy: Secondary | ICD-10-CM

## 2020-11-24 DIAGNOSIS — Z7689 Persons encountering health services in other specified circumstances: Secondary | ICD-10-CM | POA: Diagnosis not present

## 2020-11-24 DIAGNOSIS — Z3492 Encounter for supervision of normal pregnancy, unspecified, second trimester: Secondary | ICD-10-CM

## 2020-11-24 LAB — POCT URINALYSIS DIPSTICK OB
Appearance: NORMAL
Bilirubin, UA: NEGATIVE
Blood, UA: NEGATIVE
Glucose, UA: NEGATIVE
Ketones, UA: NEGATIVE
Nitrite, UA: NEGATIVE
Odor: NORMAL
Spec Grav, UA: 1.015 (ref 1.010–1.025)
Urobilinogen, UA: 0.2 E.U./dL
pH, UA: 8 (ref 5.0–8.0)

## 2020-11-24 NOTE — Progress Notes (Signed)
Reports no concerns at this time.

## 2020-11-24 NOTE — Addendum Note (Signed)
Addended by: Finis Bud on: 11/24/2020 10:28 AM   Modules accepted: Orders

## 2020-11-24 NOTE — Progress Notes (Signed)
ROB: Still having occasional nausea.  Otherwise well.  Desires AFP today.  FAS next visit.

## 2020-11-29 LAB — AFP, SERUM, OPEN SPINA BIFIDA
AFP MoM: 1.07
AFP Value: 35 ng/mL
Gest. Age on Collection Date: 15 weeks
Maternal Age At EDD: 31.2 yr
OSBR Risk 1 IN: 9862
Test Results:: NEGATIVE
Weight: 130 [lb_av]

## 2020-12-19 ENCOUNTER — Other Ambulatory Visit: Payer: Self-pay | Admitting: Obstetrics and Gynecology

## 2020-12-19 DIAGNOSIS — Z3402 Encounter for supervision of normal first pregnancy, second trimester: Secondary | ICD-10-CM

## 2020-12-21 NOTE — Patient Instructions (Signed)
Second Trimester of Pregnancy  The second trimester of pregnancy is from week 13 through week 27. This is also called months 4 through 6 of pregnancy. This is often the time when you feel your best. During the second trimester:  Morning sickness is less or has stopped.  You may have more energy.  You may feel hungry more often. At this time, your unborn baby (fetus) is growing very fast. At the end of the sixth month, the unborn baby may be up to 12 inches long and weigh about 1 pounds. You will likely start to feel the baby move between 16 and 20 weeks of pregnancy. Body changes during your second trimester Your body continues to go through many changes during this time. The changes vary and generally return to normal after the baby is born. Physical changes  You will gain more weight.  You may start to get stretch marks on your hips, belly (abdomen), and breasts.  Your breasts will grow and may hurt.  Dark spots or blotches may develop on your face.  A dark line from your belly button to the pubic area (linea nigra) may appear.  You may have changes in your hair. Health changes  You may have headaches.  You may have heartburn.  You may have trouble pooping (constipation).  You may have hemorrhoids or swollen, bulging veins (varicose veins).  Your gums may bleed.  You may pee (urinate) more often.  You may have back pain. Follow these instructions at home: Medicines  Take over-the-counter and prescription medicines only as told by your doctor. Some medicines are not safe during pregnancy.  Take a prenatal vitamin that contains at least 600 micrograms (mcg) of folic acid. Eating and drinking  Eat healthy meals that include: ? Fresh fruits and vegetables. ? Whole grains. ? Good sources of protein, such as meat, eggs, or tofu. ? Low-fat dairy products.  Avoid raw meat and unpasteurized juice, milk, and cheese.  You may need to take these actions to prevent or  treat trouble pooping: ? Drink enough fluids to keep your pee (urine) pale yellow. ? Eat foods that are high in fiber. These include beans, whole grains, and fresh fruits and vegetables. ? Limit foods that are high in fat and sugar. These include fried or sweet foods. Activity  Exercise only as told by your doctor. Most people can do their usual exercise during pregnancy. Try to exercise for 30 minutes at least 5 days a week.  Stop exercising if you have pain or cramps in your belly or lower back.  Do not exercise if it is too hot or too humid, or if you are in a place of great height (high altitude).  Avoid heavy lifting.  If you choose to, you may have sex unless your doctor tells you not to. Relieving pain and discomfort  Wear a good support bra if your breasts are sore.  Take warm water baths (sitz baths) to soothe pain or discomfort caused by hemorrhoids. Use hemorrhoid cream if your doctor approves.  Rest with your legs raised (elevated) if you have leg cramps or low back pain.  If you develop bulging veins in your legs: ? Wear support hose as told by your doctor. ? Raise your feet for 15 minutes, 3-4 times a day. ? Limit salt in your food. Safety  Wear your seat belt at all times when you are in a car.  Talk with your doctor if someone is hurting you or yelling  at you a lot. Lifestyle  Do not use hot tubs, steam rooms, or saunas.  Do not douche. Do not use tampons or scented sanitary pads.  Avoid cat litter boxes and soil used by cats. These carry germs that can harm your baby and can cause a loss of your baby by miscarriage or stillbirth.  Do not use herbal medicines, illegal drugs, or medicines that are not approved by your doctor. Do not drink alcohol.  Do not smoke or use any products that contain nicotine or tobacco. If you need help quitting, ask your doctor. General instructions  Keep all follow-up visits. This is important.  Ask your doctor about local  prenatal classes.  Ask your doctor about the right foods to eat or for help finding a counselor. Where to find more information  American Pregnancy Association: americanpregnancy.org  American College of Obstetricians and Gynecologists: www.acog.org  Office on Women's Health: womenshealth.gov/pregnancy Contact a doctor if:  You have a headache that does not go away when you take medicine.  You have changes in how you see, or you see spots in front of your eyes.  You have mild cramps, pressure, or pain in your lower belly.  You continue to feel like you may vomit (nauseous), you vomit, or you have watery poop (diarrhea).  You have bad-smelling fluid coming from your vagina.  You have pain when you pee or your pee smells bad.  You have very bad swelling of your face, hands, ankles, feet, or legs.  You have a fever. Get help right away if:  You are leaking fluid from your vagina.  You have spotting or bleeding from your vagina.  You have very bad belly cramping or pain.  You have trouble breathing.  You have chest pain.  You faint.  You have not felt your baby move for the time period told by your doctor.  You have new or increased pain, swelling, or redness in an arm or leg. Summary  The second trimester of pregnancy is from week 13 through week 27 (months 4 through 6).  Eat healthy meals.  Exercise as told by your doctor. Most people can do their usual exercise during pregnancy.  Do not use herbal medicines, illegal drugs, or medicines that are not approved by your doctor. Do not drink alcohol.  Call your doctor if you get sick or if you notice anything unusual about your pregnancy. This information is not intended to replace advice given to you by your health care provider. Make sure you discuss any questions you have with your health care provider. Document Revised: 03/10/2020 Document Reviewed: 01/15/2020 Elsevier Patient Education  2021 [employer]. Common Medications Safe in Pregnancy  Acne:      Constipation:  Benzoyl Peroxide     Colace  Clindamycin      Dulcolax Suppository  Topica Erythromycin     Fibercon  Salicylic Acid      Metamucil         Miralax AVOID:        Senakot   Accutane    Cough:  Retin-A       Cough Drops  Tetracycline      Phenergan w/ Codeine if Rx  Minocycline      Robitussin (Plain & DM)  Antibiotics:     Crabs/Lice:  Ceclor       RID  Cephalosporins    AVOID:  E-Mycins      Kwell  Keflex  Macrobid/Macrodantin   Diarrhea:    Penicillin      Kao-Pectate  Zithromax      Imodium AD         PUSH FLUIDS AVOID:       Cipro     Fever:  Tetracycline      Tylenol (Regular or Extra  Minocycline       Strength)  Levaquin      Extra Strength-Do not          Exceed 8 tabs/24 hrs Caffeine:        <200mg/day (equiv. To 1 cup of coffee or  approx. 3 12 oz sodas)         Gas: Cold/Hayfever:       Gas-X  Benadryl      Mylicon  Claritin       Phazyme  **Claritin-D        Chlor-Trimeton    Headaches:  Dimetapp      ASA-Free Excedrin  Drixoral-Non-Drowsy     Cold Compress  Mucinex (Guaifenasin)     Tylenol (Regular or Extra  Sudafed/Sudafed-12 Hour     Strength)  **Sudafed PE Pseudoephedrine   Tylenol Cold & Sinus     Vicks Vapor Rub  Zyrtec  **AVOID if Problems With Blood Pressure         Heartburn: Avoid lying down for at least 1 hour after meals  Aciphex      Maalox     Rash:  Milk of Magnesia     Benadryl    Mylanta       1% Hydrocortisone Cream  Pepcid  Pepcid Complete   Sleep Aids:  Prevacid      Ambien   Prilosec       Benadryl  Rolaids       Chamomile Tea  Tums (Limit 4/day)     Unisom         Tylenol PM         Warm milk-add vanilla or  Hemorrhoids:       Sugar for taste  Anusol/Anusol H.C.  (RX: Analapram 2.5%)  Sugar Substitutes:  Hydrocortisone OTC     Ok in moderation  Preparation H      Tucks        Vaseline lotion applied to tissue with  wiping    Herpes:     Throat:  Acyclovir      Oragel  Famvir  Valtrex     Vaccines:         Flu Shot Leg Cramps:       *Gardasil  Benadryl      Hepatitis A         Hepatitis B Nasal Spray:       Pneumovax  Saline Nasal Spray     Polio Booster         Tetanus Nausea:       Tuberculosis test or PPD  Vitamin B6 25 mg TID   AVOID:    Dramamine      *Gardasil  Emetrol       Live Poliovirus  Ginger Root 250 mg QID    MMR (measles, mumps &  High Complex Carbs @ Bedtime    rebella)  Sea Bands-Accupressure    Varicella (Chickenpox)  Unisom 1/2 tab TID     *No known complications           If received before Pain:         Known pregnancy;   Darvocet       Resume   series after  Lortab        Delivery  Percocet    Yeast:   Tramadol      Femstat  Tylenol 3      Gyne-lotrimin  Ultram       Monistat  Vicodin           MISC:         All Sunscreens           Hair Coloring/highlights          Insect Repellant's          (Including DEET)         Mystic Tans  

## 2020-12-22 ENCOUNTER — Other Ambulatory Visit: Payer: Self-pay

## 2020-12-22 ENCOUNTER — Ambulatory Visit (INDEPENDENT_AMBULATORY_CARE_PROVIDER_SITE_OTHER): Payer: 59 | Admitting: Obstetrics and Gynecology

## 2020-12-22 ENCOUNTER — Encounter: Payer: Self-pay | Admitting: Obstetrics and Gynecology

## 2020-12-22 ENCOUNTER — Other Ambulatory Visit: Payer: 59

## 2020-12-22 VITALS — BP 116/78 | HR 88 | Wt 140.9 lb

## 2020-12-22 DIAGNOSIS — Z3A19 19 weeks gestation of pregnancy: Secondary | ICD-10-CM

## 2020-12-22 DIAGNOSIS — Z3402 Encounter for supervision of normal first pregnancy, second trimester: Secondary | ICD-10-CM

## 2020-12-22 LAB — POCT URINALYSIS DIPSTICK OB
Bilirubin, UA: NEGATIVE
Blood, UA: NEGATIVE
Glucose, UA: NEGATIVE
Ketones, UA: NEGATIVE
Leukocytes, UA: NEGATIVE
Nitrite, UA: NEGATIVE
POC,PROTEIN,UA: NEGATIVE
Spec Grav, UA: 1.015 (ref 1.010–1.025)
Urobilinogen, UA: 0.2 E.U./dL
pH, UA: 6 (ref 5.0–8.0)

## 2020-12-22 NOTE — Progress Notes (Addendum)
ROB: Doing well. Does complain of a more watery white discharge lately. Denies any other symptoms. Discussed breastfeeding.  For anatomy scan in 2 weeks. Has had flu shot just  prior to pregnancy. RTC in 4 weeks.   The following were addressed during this visit:  Breastfeeding Education - Early initiation of breastfeeding    Comments: Keeps milk supply adequate, helps contract uterus and slow bleeding, and early milk is the perfect first food and is easy to digest.   - The importance of exclusive breastfeeding    Comments: Provides antibodies, Lower risk of breast and ovarian cancers, and type-2 diabetes,Helps your body recover, Reduced chance of SIDS.   - Risks of giving your baby anything other than breast milk if you are breastfeeding    Comments: Make the baby less content with breastfeeds, may make my baby more susceptible to illness, and may reduce my milk supply.   - Exclusive breastfeeding for the first 6 months    Comments: Builds a healthy milk supply and keeps it up, protects baby from sickness and disease, and breastmilk has everything your baby needs for the first 6 months.

## 2020-12-22 NOTE — Progress Notes (Signed)
OB-pt present for routine prenatal care. Pt stated having an increase in vaginal discharge that is watery.

## 2021-01-03 ENCOUNTER — Other Ambulatory Visit: Payer: Self-pay

## 2021-01-03 ENCOUNTER — Ambulatory Visit
Admission: RE | Admit: 2021-01-03 | Discharge: 2021-01-03 | Disposition: A | Discharge status: Home / Self Care | Payer: 59 | Source: Ambulatory Visit | Attending: Obstetrics and Gynecology | Admitting: Obstetrics and Gynecology

## 2021-01-03 DIAGNOSIS — Z3492 Encounter for supervision of normal pregnancy, unspecified, second trimester: Secondary | ICD-10-CM | POA: Diagnosis not present

## 2021-01-03 DIAGNOSIS — Z3402 Encounter for supervision of normal first pregnancy, second trimester: Secondary | ICD-10-CM | POA: Insufficient documentation

## 2021-01-03 DIAGNOSIS — Z3A21 21 weeks gestation of pregnancy: Secondary | ICD-10-CM | POA: Diagnosis not present

## 2021-01-19 ENCOUNTER — Other Ambulatory Visit: Payer: Self-pay

## 2021-01-19 ENCOUNTER — Encounter: Payer: Self-pay | Admitting: Obstetrics and Gynecology

## 2021-01-19 ENCOUNTER — Ambulatory Visit (INDEPENDENT_AMBULATORY_CARE_PROVIDER_SITE_OTHER): Payer: 59 | Admitting: Obstetrics and Gynecology

## 2021-01-19 VITALS — BP 112/79 | HR 73 | Wt 147.6 lb

## 2021-01-19 DIAGNOSIS — Z3A23 23 weeks gestation of pregnancy: Secondary | ICD-10-CM

## 2021-01-19 DIAGNOSIS — Z3402 Encounter for supervision of normal first pregnancy, second trimester: Secondary | ICD-10-CM

## 2021-01-19 LAB — POCT URINALYSIS DIPSTICK OB
Bilirubin, UA: NEGATIVE
Blood, UA: NEGATIVE
Glucose, UA: NEGATIVE
Ketones, UA: NEGATIVE
Leukocytes, UA: NEGATIVE
Nitrite, UA: NEGATIVE
POC,PROTEIN,UA: NEGATIVE
Spec Grav, UA: 1.01 (ref 1.010–1.025)
Urobilinogen, UA: 0.2 E.U./dL
pH, UA: 7 (ref 5.0–8.0)

## 2021-01-19 NOTE — Progress Notes (Signed)
ROB: No complaints.  Patient feels daily fetal movement.  Anatomy ultrasound complete.  1 hour GCT next visit.

## 2021-01-25 ENCOUNTER — Encounter: Payer: 59 | Admitting: Obstetrics and Gynecology

## 2021-01-25 ENCOUNTER — Other Ambulatory Visit: Payer: 59

## 2021-01-25 DIAGNOSIS — M5489 Other dorsalgia: Secondary | ICD-10-CM | POA: Diagnosis not present

## 2021-02-21 NOTE — Progress Notes (Signed)
OB-Pt present for routine prenatal care. Pt stated that she was doing well. tdap and btc completed.

## 2021-02-21 NOTE — Patient Instructions (Addendum)
Tdap (Tetanus, Diphtheria, Pertussis) Vaccine: What You Need to Know 1. Why get vaccinated? Tdap vaccine can prevent tetanus, diphtheria, and pertussis. Diphtheria and pertussis spread from person to person. Tetanus enters the body through cuts or wounds.  TETANUS (T) causes painful stiffening of the muscles. Tetanus can lead to serious health problems, including being unable to open the mouth, having trouble swallowing and breathing, or death.  DIPHTHERIA (D) can lead to difficulty breathing, heart failure, paralysis, or death.  PERTUSSIS (aP), also known as "whooping cough," can cause uncontrollable, violent coughing that makes it hard to breathe, eat, or drink. Pertussis can be extremely serious especially in babies and young children, causing pneumonia, convulsions, brain damage, or death. In teens and adults, it can cause weight loss, loss of bladder control, passing out, and rib fractures from severe coughing. 2. Tdap vaccine Tdap is only for children 7 years and older, adolescents, and adults.  Adolescents should receive a single dose of Tdap, preferably at age 51 or 49 years. Pregnant people should get a dose of Tdap during every pregnancy, preferably during the early part of the third trimester, to help protect the newborn from pertussis. Infants are most at risk for severe, life-threatening complications from pertussis. Adults who have never received Tdap should get a dose of Tdap. Also, adults should receive a booster dose of either Tdap or Td (a different vaccine that protects against tetanus and diphtheria but not pertussis) every 10 years, or after 5 years in the case of a severe or dirty wound or burn. Tdap may be given at the same time as other vaccines. 3. Talk with your health care provider Tell your vaccine provider if the person getting the vaccine:  Has had an allergic reaction after a previous dose of any vaccine that protects against tetanus, diphtheria, or pertussis, or  has any severe, life-threatening allergies  Has had a coma, decreased level of consciousness, or prolonged seizures within 7 days after a previous dose of any pertussis vaccine (DTP, DTaP, or Tdap)  Has seizures or another nervous system problem  Has ever had Guillain-Barr Syndrome (also called "GBS")  Has had severe pain or swelling after a previous dose of any vaccine that protects against tetanus or diphtheria In some cases, your health care provider may decide to postpone Tdap vaccination until a future visit. People with minor illnesses, such as a cold, may be vaccinated. People who are moderately or severely ill should usually wait until they recover before getting Tdap vaccine.  Your health care provider can give you more information. 4. Risks of a vaccine reaction  Pain, redness, or swelling where the shot was given, mild fever, headache, feeling tired, and nausea, vomiting, diarrhea, or stomachache sometimes happen after Tdap vaccination. People sometimes faint after medical procedures, including vaccination. Tell your provider if you feel dizzy or have vision changes or ringing in the ears.  As with any medicine, there is a very remote chance of a vaccine causing a severe allergic reaction, other serious injury, or death. 5. What if there is a serious problem? An allergic reaction could occur after the vaccinated person leaves the clinic. If you see signs of a severe allergic reaction (hives, swelling of the face and throat, difficulty breathing, a fast heartbeat, dizziness, or weakness), call 9-1-1 and get the person to the nearest hospital. For other signs that concern you, call your health care provider.  Adverse reactions should be reported to the Vaccine Adverse Event Reporting System (VAERS). Your health  care provider will usually file this report, or you can do it yourself. Visit the VAERS website at www.vaers.SamedayNews.es or call 510-371-5295. VAERS is only for reporting  reactions, and VAERS staff members do not give medical advice. 6. The National Vaccine Injury Compensation Program The [employer] Vaccine Injury Compensation Program (VICP) is a federal program that was created to compensate people who may have been injured by certain vaccines. Claims regarding alleged injury or death due to vaccination have a time limit for filing, which may be as short as two years. Visit the VICP website at GoldCloset.com.ee or call 434-716-1772 to learn about the program and about filing a claim. 7. How can I learn more?  Ask your health care provider.  Call your local or state health department.  Visit the website of the Food and Drug Administration (FDA) for vaccine package inserts and additional information at TraderRating.uy.  Contact the Centers for Disease Control and Prevention (CDC): ? Call 609-278-3326 (1-800-CDC-INFO) or ? Visit CDC's website at http://hunter.com/. Vaccine Information Statement Tdap (Tetanus, Diphtheria, Pertussis) Vaccine (05/21/2020) This information is not intended to replace advice given to you by your health care provider. Make sure you discuss any questions you have with your health care provider. Document Revised: 06/16/2020 Document Reviewed: 06/16/2020 Elsevier Patient Education  [place]. Common Medications Safe in Pregnancy  Acne:      Constipation:  Benzoyl Peroxide     Colace  Clindamycin      Dulcolax Suppository  Topica Erythromycin     Fibercon  Salicylic Acid      Metamucil         Miralax AVOID:        Senakot   Accutane    Cough:  Retin-A       Cough Drops  Tetracycline      Phenergan w/ Codeine if Rx  Minocycline      Robitussin (Plain & DM)  Antibiotics:     Crabs/Lice:  Ceclor       RID  Cephalosporins    AVOID:  E-Mycins      Kwell  Keflex  Macrobid/Macrodantin   Diarrhea:  Penicillin      Kao-Pectate  Zithromax      Imodium AD         PUSH  FLUIDS AVOID:       Cipro     Fever:  Tetracycline      Tylenol (Regular or Extra  Minocycline       Strength)  Levaquin      Extra Strength-Do not          Exceed 8 tabs/24 hrs Caffeine:        '200mg'$ /day (equiv. To 1 cup of coffee or  approx. 3 12 oz sodas)         Gas: Cold/Hayfever:       Gas-X  Benadryl      Mylicon  Claritin       Phazyme  **Claritin-D        Chlor-Trimeton    Headaches:  Dimetapp      ASA-Free Excedrin  Drixoral-Non-Drowsy     Cold Compress  Mucinex (Guaifenasin)     Tylenol (Regular or Extra  Sudafed/Sudafed-12 Hour     Strength)  **Sudafed PE Pseudoephedrine   Tylenol Cold & Sinus     Vicks Vapor Rub  Zyrtec  **AVOID if Problems With Blood Pressure         Heartburn: Avoid lying down for at least 1 hour after  meals  Aciphex      Maalox     Rash:  Milk of Magnesia     Benadryl    Mylanta       1% Hydrocortisone Cream  Pepcid  Pepcid Complete   Sleep Aids:  Prevacid      Ambien   Prilosec       Benadryl  Rolaids       Chamomile Tea  Tums (Limit 4/day)     Unisom         Tylenol PM         Warm milk-add vanilla or  Hemorrhoids:       Sugar for taste  Anusol/Anusol H.C.  (RX: Analapram 2.5%)  Sugar Substitutes:  Hydrocortisone OTC     Ok in moderation  Preparation H      Tucks        Vaseline lotion applied to tissue with wiping    Herpes:     Throat:  Acyclovir      Oragel  Famvir  Valtrex     Vaccines:         Flu Shot Leg Cramps:       *Gardasil  Benadryl      Hepatitis A         Hepatitis B Nasal Spray:       Pneumovax  Saline Nasal Spray     Polio Booster         Tetanus Nausea:       Tuberculosis test or PPD  Vitamin B6 25 mg TID   AVOID:    Dramamine      *Gardasil  Emetrol       Live Poliovirus  Ginger Root 250 mg QID    MMR (measles, mumps &  High Complex Carbs @ Bedtime    rebella)  Sea Bands-Accupressure    Varicella (Chickenpox)  Unisom 1/2 tab TID     *No known complications           If received  before Pain:         Known pregnancy;   Darvocet       Resume series after  Lortab        Delivery  Percocet    Yeast:   Tramadol      Femstat  Tylenol 3      Gyne-lotrimin  Ultram       Monistat  Vicodin           MISC:         All Sunscreens           Hair Coloring/highlights          Insect Repellant's          (Including DEET)         Mystic Tans Breastfeeding  Choosing to breastfeed is one of the best decisions you can make for yourself and your baby. A change in hormones during pregnancy causes your breasts to make breast milk in your milk-producing glands. Hormones prevent breast milk from being released before your baby is born. They also prompt milk flow after birth. Once breastfeeding has begun, thoughts of your baby, as well as his or her sucking or crying, can stimulate the release of milk from your milk-producing glands. Benefits of breastfeeding Research shows that breastfeeding offers many health benefits for infants and mothers. It also offers a cost-free and convenient way to feed your baby. For your baby  Your first milk (colostrum) helps your baby's  digestive system to function better.  Special cells in your milk (antibodies) help your baby to fight off infections.  Breastfed babies are less likely to develop asthma, allergies, obesity, or type 2 diabetes. They are also at lower risk for sudden infant death syndrome (SIDS).  Nutrients in breast milk are better able to meet your baby's needs compared to infant formula.  Breast milk improves your baby's brain development. For you  Breastfeeding helps to create a very special bond between you and your baby.  Breastfeeding is convenient. Breast milk costs nothing and is always available at the correct temperature.  Breastfeeding helps to burn calories. It helps you to lose the weight that you gained during pregnancy.  Breastfeeding makes your uterus return faster to its size before pregnancy. It also slows  bleeding (lochia) after you give birth.  Breastfeeding helps to lower your risk of developing type 2 diabetes, osteoporosis, rheumatoid arthritis, cardiovascular disease, and breast, ovarian, uterine, and endometrial cancer later in life. Breastfeeding basics Starting breastfeeding  Find a comfortable place to sit or lie down, with your neck and back well-supported.  Place a pillow or a rolled-up blanket under your baby to bring him or her to the level of your breast (if you are seated). Nursing pillows are specially designed to help support your arms and your baby while you breastfeed.  Make sure that your baby's tummy (abdomen) is facing your abdomen.  Gently massage your breast. With your fingertips, massage from the outer edges of your breast inward toward the nipple. This encourages milk flow. If your milk flows slowly, you may need to continue this action during the feeding.  Support your breast with 4 fingers underneath and your thumb above your nipple (make the letter "C" with your hand). Make sure your fingers are well away from your nipple and your baby's mouth.  Stroke your baby's lips gently with your finger or nipple.  When your baby's mouth is open wide enough, quickly bring your baby to your breast, placing your entire nipple and as much of the areola as possible into your baby's mouth. The areola is the colored area around your nipple. ? More areola should be visible above your baby's upper lip than below the lower lip. ? Your baby's lips should be opened and extended outward (flanged) to ensure an adequate, comfortable latch. ? Your baby's tongue should be between his or her lower gum and your breast.  Make sure that your baby's mouth is correctly positioned around your nipple (latched). Your baby's lips should create a seal on your breast and be turned out (everted).  It is common for your baby to suck about 2-3 minutes in order to start the flow of breast  milk. Latching Teaching your baby how to latch onto your breast properly is very important. An improper latch can cause nipple pain, decreased milk supply, and poor weight gain in your baby. Also, if your baby is not latched onto your nipple properly, he or she may swallow some air during feeding. This can make your baby fussy. Burping your baby when you switch breasts during the feeding can help to get rid of the air. However, teaching your baby to latch on properly is still the best way to prevent fussiness from swallowing air while breastfeeding. Signs that your baby has successfully latched onto your nipple  Silent tugging or silent sucking, without causing you pain. Infant's lips should be extended outward (flanged).  Swallowing heard between every 3-4 sucks once  your milk has started to flow (after your let-down milk reflex occurs).  Muscle movement above and in front of his or her ears while sucking. Signs that your baby has not successfully latched onto your nipple  Sucking sounds or smacking sounds from your baby while breastfeeding.  Nipple pain. If you think your baby has not latched on correctly, slip your finger into the corner of your baby's mouth to break the suction and place it between your baby's gums. Attempt to start breastfeeding again. Signs of successful breastfeeding Signs from your baby  Your baby will gradually decrease the number of sucks or will completely stop sucking.  Your baby will fall asleep.  Your baby's body will relax.  Your baby will retain a small amount of milk in his or her mouth.  Your baby will let go of your breast by himself or herself. Signs from you  Breasts that have increased in firmness, weight, and size 1-3 hours after feeding.  Breasts that are softer immediately after breastfeeding.  Increased milk volume, as well as a change in milk consistency and color by the fifth day of breastfeeding.  Nipples that are not sore, cracked, or  bleeding. Signs that your baby is getting enough milk  Wetting at least 1-2 diapers during the first 24 hours after birth.  Wetting at least 5-6 diapers every 24 hours for the first week after birth. The urine should be clear or pale yellow by the age of 5 days.  Wetting 6-8 diapers every 24 hours as your baby continues to grow and develop.  At least 3 stools in a 24-hour period by the age of 5 days. The stool should be soft and yellow.  At least 3 stools in a 24-hour period by the age of 7 days. The stool should be seedy and yellow.  No loss of weight greater than 10% of birth weight during the first 3 days of life.  Average weight gain of 4-7 oz (113-198 g) per week after the age of 4 days.  Consistent daily weight gain by the age of 5 days, without weight loss after the age of 2 weeks. After a feeding, your baby may spit up a small amount of milk. This is normal. Breastfeeding frequency and duration Frequent feeding will help you make more milk and can prevent sore nipples and extremely full breasts (breast engorgement). Breastfeed when you feel the need to reduce the fullness of your breasts or when your baby shows signs of hunger. This is called "breastfeeding on demand." Signs that your baby is hungry include:  Increased alertness, activity, or restlessness.  Movement of the head from side to side.  Opening of the mouth when the corner of the mouth or cheek is stroked (rooting).  Increased sucking sounds, smacking lips, cooing, sighing, or squeaking.  Hand-to-mouth movements and sucking on fingers or hands.  Fussing or crying. Avoid introducing a pacifier to your baby in the first 4-6 weeks after your baby is born. After this time, you may choose to use a pacifier. Research has shown that pacifier use during the first year of a baby's life decreases the risk of sudden infant death syndrome (SIDS). Allow your baby to feed on each breast as long as he or she wants. When your  baby unlatches or falls asleep while feeding from the first breast, offer the second breast. Because newborns are often sleepy in the first few weeks of life, you may need to awaken your baby to get  him or her to feed. Breastfeeding times will vary from baby to baby. However, the following rules can serve as a guide to help you make sure that your baby is properly fed:  Newborns (babies 42 weeks of age or younger) may breastfeed every 1-3 hours.  Newborns should not go without breastfeeding for longer than 3 hours during the day or 5 hours during the night.  You should breastfeed your baby a minimum of 8 times in a 24-hour period. Breast milk pumping Pumping and storing breast milk allows you to make sure that your baby is exclusively fed your breast milk, even at times when you are unable to breastfeed. This is especially important if you go back to work while you are still breastfeeding, or if you are not able to be present during feedings. Your lactation consultant can help you find a method of pumping that works best for you and give you guidelines about how long it is safe to store breast milk.      Caring for your breasts while you breastfeed Nipples can become dry, cracked, and sore while breastfeeding. The following recommendations can help keep your breasts moisturized and healthy:  Avoid using soap on your nipples.  Wear a supportive bra designed especially for nursing. Avoid wearing underwire-style bras or extremely tight bras (sports bras).  Air-dry your nipples for 3-4 minutes after each feeding.  Use only cotton bra pads to absorb leaked breast milk. Leaking of breast milk between feedings is normal.  Use lanolin on your nipples after breastfeeding. Lanolin helps to maintain your skin's normal moisture barrier. Pure lanolin is not harmful (not toxic) to your baby. You may also hand express a few drops of breast milk and gently massage that milk into your nipples and allow the milk  to air-dry. In the first few weeks after giving birth, some women experience breast engorgement. Engorgement can make your breasts feel heavy, warm, and tender to the touch. Engorgement peaks within 3-5 days after you give birth. The following recommendations can help to ease engorgement:  Completely empty your breasts while breastfeeding or pumping. You may want to start by applying warm, moist heat (in the shower or with warm, water-soaked hand towels) just before feeding or pumping. This increases circulation and helps the milk flow. If your baby does not completely empty your breasts while breastfeeding, pump any extra milk after he or she is finished.  Apply ice packs to your breasts immediately after breastfeeding or pumping, unless this is too uncomfortable for you. To do this: ? Put ice in a plastic bag. ? Place a towel between your skin and the bag. ? Leave the ice on for 20 minutes, 2-3 times a day.  Make sure that your baby is latched on and positioned properly while breastfeeding. If engorgement persists after 48 hours of following these recommendations, contact your health care provider or a Science writer. Overall health care recommendations while breastfeeding  Eat 3 healthy meals and 3 snacks every day. Well-nourished mothers who are breastfeeding need an additional 450-500 calories a day. You can meet this requirement by increasing the amount of a balanced diet that you eat.  Drink enough water to keep your urine pale yellow or clear.  Rest often, relax, and continue to take your prenatal vitamins to prevent fatigue, stress, and low vitamin and mineral levels in your body (nutrient deficiencies).  Do not use any products that contain nicotine or tobacco, such as cigarettes and e-cigarettes. Your baby may  be harmed by chemicals from cigarettes that pass into breast milk and exposure to secondhand smoke. If you need help quitting, ask your health care provider.  Avoid  alcohol.  Do not use illegal drugs or marijuana.  Talk with your health care provider before taking any medicines. These include over-the-counter and prescription medicines as well as vitamins and herbal supplements. Some medicines that may be harmful to your baby can pass through breast milk.  It is possible to become pregnant while breastfeeding. If birth control is desired, ask your health care provider about options that will be safe while breastfeeding your baby. Where to find more information: [employer]: www.llli.org Contact a health care provider if:  You feel like you want to stop breastfeeding or have become frustrated with breastfeeding.  Your nipples are cracked or bleeding.  Your breasts are red, tender, or warm.  You have: ? Painful breasts or nipples. ? A swollen area on either breast. ? A fever or chills. ? Nausea or vomiting. ? Drainage other than breast milk from your nipples.  Your breasts do not become full before feedings by the fifth day after you give birth.  You feel sad and depressed.  Your baby is: ? Too sleepy to eat well. ? Having trouble sleeping. ? More than 62 week old and wetting fewer than 6 diapers in a 24-hour period. ? Not gaining weight by 42 days of age.  Your baby has fewer than 3 stools in a 24-hour period.  Your baby's skin or the white parts of his or her eyes become yellow. Get help right away if:  Your baby is overly tired (lethargic) and does not want to wake up and feed.  Your baby develops an unexplained fever. Summary  Breastfeeding offers many health benefits for infant and mothers.  Try to breastfeed your infant when he or she shows early signs of hunger.  Gently tickle or stroke your baby's lips with your finger or nipple to allow the baby to open his or her mouth. Bring the baby to your breast. Make sure that much of the areola is in your baby's mouth. Offer one side and burp the baby before you offer  the other side.  Talk with your health care provider or lactation consultant if you have questions or you face problems as you breastfeed. This information is not intended to replace advice given to you by your health care provider. Make sure you discuss any questions you have with your health care provider. Document Revised: 12/27/2017 Document Reviewed: 11/03/2016 Elsevier Patient Education  [place].    Breastfeeding Tips for a Good Latch Breastfeeding can be challenging, especially during the first few weeks after childbirth. It is normal to have some problems when you start to breastfeed your new baby, even if you have breastfed before. Latching is an important part of having a good breastfeeding experience. This refers to how your baby's mouth attaches to your nipple to breastfeed. Your baby may have trouble latching due to:  Poor positioning.  Nipple confusion. This can occur if you introduce a bottle or pacifier too early.  Abnormalities in your baby's mouth, tongue, or lips. This includes conditions like tongue-tie or cleft lip.  The shape of your nipples, such as nipples that are flat or turned in (inverted).  Your baby being born early (prematurely). Small babies often have a weak suck.  Breasts becoming overfilled with milk (engorged breasts). Breasts that are too full can make a  latch difficult. If breasts are engorged, express a little milk to help soften the breast. Work with a breastfeeding specialist (Science writer) to find positions and strategies that will help make sure your baby has a good latch. How does this affect me? A poor latch may cause you to have problems such as:  Cracked or sore nipples.  Engorged breasts.  Plugged milk ducts.  Low milk supply.  Breast inflammation or infection. How does this affect my baby? A poor latch may cause your baby to not be able to feed effectively. As a result, he or she may have trouble gaining  weight. Follow these instructions at home: How to position your baby  Find a comfortable place to sit or lie down, with your neck and back well supported.  If you are seated, place a pillow or rolled-up blanket under your baby to bring him or her to the level of your breast.  Make sure that your baby's belly is facing your abdomen.  Try different positions to find one that works best for you and your baby. How to help your baby latch To begin each breastfeeding session, you may find it helpful to gently massage your breast. With your fingertips, massage from your chest wall toward your nipple in a circular motion. This encourages milk flow. If your milk flows slowly, you may need to continue this action during feeding. Position your breast for an ideal latch. Support your breast with four fingers underneath and your thumb above your nipple. Keep your fingers away from your nipple and your baby's mouth. To help your baby latch, follow these steps: 1. Stroke your baby's lips gently with your finger or nipple. 2. When your baby's mouth is open wide enough, quickly bring your baby to your breast and place your entire nipple, and as much of the areolaas possible, into your baby's mouth. The areola is the colored area around your nipple. 3. Your baby's tongue should be between his or her lower gum and your breast. 4. More areola should be visible above your baby's upper lip than below the lower lip. 5. When your baby starts sucking, you will feel a gentle pull on your nipple, but you should not feel pain. Be patient. It is common for a baby to suck for about 2-3 minutes to start the flow of breast milk. 6. Make sure that your baby's mouth is correctly positioned around your nipple. Your baby's lips should create a seal on your breast and be turned outward (everted).   General instructions  Look for the following signs that your baby has successfully latched on to your nipple: ? The baby is quietly  tugging or quietly sucking without causing you pain. ? You hear the baby swallow after every 3 or 4 sucks. ? You see muscle movement above and in front of the baby's ears while he or she is sucking.  Be aware of these signs that your baby has not successfully latched on to your nipple: ? The baby makes sucking sounds or smacking sounds while nursing. ? You have nipple pain.  If your baby is not latched well, insert your little finger between your baby's gums and your nipple to break the seal. Then try to help your baby latch again. Contact a health care provider if:  You have cracking or soreness in your nipples that lasts longer than 1 week.  You have nipple pain. Nipple cracking and soreness are common during the first week after birth, but nipple  pain is never normal.  You have breast engorgement that does not improve after 48-72 hours.  You have a plugged milk duct and a fever.  You follow suggestions for a good latch but you continue to have problems or concerns.  You have pus-like discharge coming from your breast.  Your baby is not gaining weight or loses weight. Summary  Many common breastfeeding challenges are caused by poor latching.  Latching refers to how your baby's mouth attaches to your nipple during breastfeeding.  If problems continue, seek help from a lactation consultant in your community. He or she can assess you and your baby for any latching problems.  The lactation consultant can work with you to develop a plan to overcome any breastfeeding challenges. This information is not intended to replace advice given to you by your health care provider. Make sure you discuss any questions you have with your health care provider. Document Revised: 03/31/2020 Document Reviewed: 03/31/2020 Elsevier Patient Education  [place].

## 2021-02-23 ENCOUNTER — Encounter: Payer: Self-pay | Admitting: Obstetrics and Gynecology

## 2021-02-23 ENCOUNTER — Ambulatory Visit (INDEPENDENT_AMBULATORY_CARE_PROVIDER_SITE_OTHER): Payer: 59 | Admitting: Obstetrics and Gynecology

## 2021-02-23 ENCOUNTER — Other Ambulatory Visit: Payer: Self-pay

## 2021-02-23 ENCOUNTER — Other Ambulatory Visit: Payer: 59

## 2021-02-23 VITALS — BP 118/79 | HR 105 | Wt 155.7 lb

## 2021-02-23 DIAGNOSIS — Z3A28 28 weeks gestation of pregnancy: Secondary | ICD-10-CM

## 2021-02-23 DIAGNOSIS — Z3402 Encounter for supervision of normal first pregnancy, second trimester: Secondary | ICD-10-CM | POA: Diagnosis not present

## 2021-02-23 DIAGNOSIS — Z23 Encounter for immunization: Secondary | ICD-10-CM

## 2021-02-23 DIAGNOSIS — Z3403 Encounter for supervision of normal first pregnancy, third trimester: Secondary | ICD-10-CM

## 2021-02-23 LAB — POCT URINALYSIS DIPSTICK OB
Bilirubin, UA: NEGATIVE
Blood, UA: NEGATIVE
Glucose, UA: NEGATIVE
Ketones, UA: NEGATIVE
Nitrite, UA: NEGATIVE
POC,PROTEIN,UA: NEGATIVE
Spec Grav, UA: 1.01 (ref 1.010–1.025)
Urobilinogen, UA: 0.2 E.U./dL
pH, UA: 7 (ref 5.0–8.0)

## 2021-02-23 MED ORDER — TETANUS-DIPHTH-ACELL PERTUSSIS 5-2.5-18.5 LF-MCG/0.5 IM SUSY
0.5000 mL | PREFILLED_SYRINGE | Freq: Once | INTRAMUSCULAR | Status: AC
  Administered 2021-02-23: 0.5 mL via INTRAMUSCULAR

## 2021-02-23 NOTE — Progress Notes (Signed)
ROB: Doing well, no major issues.  For 28 week labs today.  Discussed breastfeeding.  Desires unsure method for contraception. For Tdap today, signed blood consent, discussed cord blood banking. Discussed childbirth classes. Desires circumcision. Discussed pediatrician list. RTC in 2 weeks.   The following were addressed during this visit:  Breastfeeding Education - Nonpharmacological pain relief methods for labor    Comments: Deep breathing, focusing on pleasant things, movement and walking, heating pads or cold compress, massage and relaxation, continuous support from someone you trust, and Doulas   - The importance of early skin-to-skin contact    Comments: Keeps baby warm and secure, helps keep baby's blood sugar up and breathing steady, easier to bond and breastfeed, and helps calm baby.  - Rooming-in on a 24-hour basis    Comments: Easier to learn baby's feeding cues, easier to bond and get to know each other, and encourages milk production.   - Feeding on demand or baby-led feeding    Comments: Helps prevent breastfeeding complications, helps bring in good milk supply, prevents under or overfeeding, and helps baby feel content and satisfied   - Frequent feeding to help assure optimal milk production    Comments: Making a full supply of milk requires frequent removal of milk from breasts, infant will eat 8-12 times in 24 hours, if separated from infant use breast massage, hand expression and/ or pumping to remove milk from breasts.   - Effective positioning and attachment    Comments: Helps my baby to get enough breast milk, helps to produce an adequate milk supply, and helps prevent nipple pain and damage

## 2021-02-28 LAB — CBC
Hematocrit: 32.3 % — ABNORMAL LOW (ref 34.0–46.6)
Hemoglobin: 10.8 g/dL — ABNORMAL LOW (ref 11.1–15.9)
MCH: 31.4 pg (ref 26.6–33.0)
MCHC: 33.4 g/dL (ref 31.5–35.7)
MCV: 94 fL (ref 79–97)
Platelets: 197 10*3/uL (ref 150–450)
RBC: 3.44 x10E6/uL — ABNORMAL LOW (ref 3.77–5.28)
RDW: 11.4 % — ABNORMAL LOW (ref 11.7–15.4)
WBC: 10.2 10*3/uL (ref 3.4–10.8)

## 2021-02-28 LAB — RPR: RPR Ser Ql: NONREACTIVE

## 2021-02-28 LAB — GLUCOSE, 1 HOUR GESTATIONAL: Gestational Diabetes Screen: 99 mg/dL (ref 65–139)

## 2021-03-09 ENCOUNTER — Ambulatory Visit (INDEPENDENT_AMBULATORY_CARE_PROVIDER_SITE_OTHER): Payer: 59 | Admitting: Obstetrics and Gynecology

## 2021-03-09 ENCOUNTER — Other Ambulatory Visit: Payer: Self-pay

## 2021-03-09 VITALS — BP 121/79 | HR 105 | Wt 158.8 lb

## 2021-03-09 DIAGNOSIS — Z3403 Encounter for supervision of normal first pregnancy, third trimester: Secondary | ICD-10-CM

## 2021-03-09 DIAGNOSIS — Z3A30 30 weeks gestation of pregnancy: Secondary | ICD-10-CM

## 2021-03-09 LAB — POCT URINALYSIS DIPSTICK OB
Bilirubin, UA: NEGATIVE
Blood, UA: NEGATIVE
Glucose, UA: NEGATIVE
Ketones, UA: NEGATIVE
Leukocytes, UA: NEGATIVE
Nitrite, UA: NEGATIVE
POC,PROTEIN,UA: NEGATIVE
Spec Grav, UA: 1.01 (ref 1.010–1.025)
Urobilinogen, UA: 0.2 E.U./dL
pH, UA: 6 (ref 5.0–8.0)

## 2021-03-09 NOTE — Progress Notes (Signed)
ROB: Occasional difficulty sleeping but otherwise well.  Active baby during exam.

## 2021-03-23 ENCOUNTER — Encounter: Payer: 59 | Admitting: Obstetrics and Gynecology

## 2021-03-30 ENCOUNTER — Other Ambulatory Visit: Payer: Self-pay

## 2021-03-30 ENCOUNTER — Ambulatory Visit (INDEPENDENT_AMBULATORY_CARE_PROVIDER_SITE_OTHER): Payer: 59 | Admitting: Obstetrics and Gynecology

## 2021-03-30 ENCOUNTER — Encounter: Payer: Self-pay | Admitting: Obstetrics and Gynecology

## 2021-03-30 VITALS — BP 124/85 | HR 101 | Wt 164.3 lb

## 2021-03-30 DIAGNOSIS — Z3A33 33 weeks gestation of pregnancy: Secondary | ICD-10-CM

## 2021-03-30 DIAGNOSIS — Z3403 Encounter for supervision of normal first pregnancy, third trimester: Secondary | ICD-10-CM

## 2021-03-30 LAB — POCT URINALYSIS DIPSTICK OB
Bilirubin, UA: NEGATIVE
Blood, UA: NEGATIVE
Glucose, UA: NEGATIVE
Ketones, UA: NEGATIVE
Nitrite, UA: NEGATIVE
Spec Grav, UA: 1.02 (ref 1.010–1.025)
Urobilinogen, UA: 0.2 E.U./dL
pH, UA: 6 (ref 5.0–8.0)

## 2021-03-30 NOTE — Progress Notes (Signed)
ROB: She is feeling good, no new concerns.

## 2021-03-30 NOTE — Progress Notes (Signed)
ROB: Doing well, no issues.  Discussed birth plan, notes she is very laid back, no major desires except delayed cord clamping. RTC in 2 weeks.

## 2021-03-31 DIAGNOSIS — Z3482 Encounter for supervision of other normal pregnancy, second trimester: Secondary | ICD-10-CM | POA: Diagnosis not present

## 2021-03-31 DIAGNOSIS — Z3483 Encounter for supervision of other normal pregnancy, third trimester: Secondary | ICD-10-CM | POA: Diagnosis not present

## 2021-04-12 NOTE — Patient Instructions (Signed)
Common Medications Safe in Pregnancy  Acne:      Constipation:  Benzoyl Peroxide     Colace  Clindamycin      Dulcolax Suppository  Topica Erythromycin     Fibercon  Salicylic Acid      Metamucil         Miralax AVOID:        Senakot   Accutane    Cough:  Retin-A       Cough Drops  Tetracycline      Phenergan w/ Codeine if Rx  Minocycline      Robitussin (Plain & DM)  Antibiotics:     Crabs/Lice:  Ceclor       RID  Cephalosporins    AVOID:  E-Mycins      Kwell  Keflex  Macrobid/Macrodantin   Diarrhea:  Penicillin      Kao-Pectate  Zithromax      Imodium AD         PUSH FLUIDS AVOID:       Cipro     Fever:  Tetracycline      Tylenol (Regular or Extra  Minocycline       Strength)  Levaquin      Extra Strength-Do not          Exceed 8 tabs/24 hrs Caffeine:        <236m/day (equiv. To 1 cup of coffee or  approx. 3 12 oz sodas)         Gas: Cold/Hayfever:       Gas-X  Benadryl      Mylicon  Claritin       Phazyme  **Claritin-D        Chlor-Trimeton    Headaches:  Dimetapp      ASA-Free Excedrin  Drixoral-Non-Drowsy     Cold Compress  Mucinex (Guaifenasin)     Tylenol (Regular or Extra  Sudafed/Sudafed-12 Hour     Strength)  **Sudafed PE Pseudoephedrine   Tylenol Cold & Sinus     Vicks Vapor Rub  Zyrtec  **AVOID if Problems With Blood Pressure         Heartburn: Avoid lying down for at least 1 hour after meals  Aciphex      Maalox     Rash:  Milk of Magnesia     Benadryl    Mylanta       1% Hydrocortisone Cream  Pepcid  Pepcid Complete   Sleep Aids:  Prevacid      Ambien   Prilosec       Benadryl  Rolaids       Chamomile Tea  Tums (Limit 4/day)     Unisom         Tylenol PM         Warm milk-add vanilla or  Hemorrhoids:       Sugar for taste  Anusol/Anusol H.C.  (RX: Analapram 2.5%)  Sugar Substitutes:  Hydrocortisone OTC     Ok in moderation  Preparation H      Tucks        Vaseline lotion applied to tissue with  wiping    Herpes:     Throat:  Acyclovir      Oragel  Famvir  Valtrex     Vaccines:         Flu Shot Leg Cramps:       *Gardasil  Benadryl      Hepatitis A         Hepatitis B Nasal Spray:  Pneumovax  Saline Nasal Spray     Polio Booster         Tetanus Nausea:       Tuberculosis test or PPD  Vitamin B6 25 mg TID   AVOID:    Dramamine      *Gardasil  Emetrol       Live Poliovirus  Ginger Root 250 mg QID    MMR (measles, mumps &  High Complex Carbs @ Bedtime    rebella)  Sea Bands-Accupressure    Varicella (Chickenpox)  Unisom 1/2 tab TID     *No known complications           If received before Pain:         Known pregnancy;   Darvocet       Resume series after  Lortab        Delivery  Percocet    Yeast:   Tramadol      Femstat  Tylenol 3      Gyne-lotrimin  Ultram       Monistat  Vicodin           MISC:         All Sunscreens           Hair Coloring/highlights          Insect Repellant's          (Including DEET)         Mystic Tans

## 2021-04-13 ENCOUNTER — Other Ambulatory Visit: Payer: Self-pay

## 2021-04-13 ENCOUNTER — Encounter: Payer: Self-pay | Admitting: Obstetrics and Gynecology

## 2021-04-13 ENCOUNTER — Ambulatory Visit (INDEPENDENT_AMBULATORY_CARE_PROVIDER_SITE_OTHER): Payer: 59 | Admitting: Obstetrics and Gynecology

## 2021-04-13 VITALS — BP 123/84 | HR 101 | Wt 168.3 lb

## 2021-04-13 DIAGNOSIS — N898 Other specified noninflammatory disorders of vagina: Secondary | ICD-10-CM

## 2021-04-13 DIAGNOSIS — K219 Gastro-esophageal reflux disease without esophagitis: Secondary | ICD-10-CM

## 2021-04-13 DIAGNOSIS — Z3A35 35 weeks gestation of pregnancy: Secondary | ICD-10-CM

## 2021-04-13 DIAGNOSIS — Z3403 Encounter for supervision of normal first pregnancy, third trimester: Secondary | ICD-10-CM

## 2021-04-13 LAB — POCT URINALYSIS DIPSTICK OB
Bilirubin, UA: NEGATIVE
Glucose, UA: NEGATIVE
Ketones, UA: NEGATIVE
Nitrite, UA: NEGATIVE
POC,PROTEIN,UA: NEGATIVE
Spec Grav, UA: 1.01 (ref 1.010–1.025)
Urobilinogen, UA: 0.2 E.U./dL
pH, UA: 7.5 (ref 5.0–8.0)

## 2021-04-13 NOTE — Progress Notes (Signed)
Renee Hart: Notes that she is having worsening heartburn despite use of Tums. Advised on OTC Zantac or Nexium.  Also c/o vaginal itching and burning, sometimes with wiping. Notes an associated thick discharge. Likely yeast infection. Will utilize Monistat OTC.  RTC in 1 week, for 36 week labs at that time.

## 2021-04-13 NOTE — Progress Notes (Signed)
OB-Pt present for routine prenatal care. Pt stated fetal movement present; braxton hick contractions present; no vaginal bleeding and no changes in vaginal discharge.

## 2021-04-20 ENCOUNTER — Ambulatory Visit (INDEPENDENT_AMBULATORY_CARE_PROVIDER_SITE_OTHER): Payer: 59 | Admitting: Obstetrics and Gynecology

## 2021-04-20 ENCOUNTER — Encounter: Payer: Self-pay | Admitting: Obstetrics and Gynecology

## 2021-04-20 ENCOUNTER — Other Ambulatory Visit: Payer: Self-pay

## 2021-04-20 VITALS — BP 135/87 | HR 97 | Wt 170.9 lb

## 2021-04-20 DIAGNOSIS — Z3403 Encounter for supervision of normal first pregnancy, third trimester: Secondary | ICD-10-CM | POA: Diagnosis not present

## 2021-04-20 DIAGNOSIS — Z3A36 36 weeks gestation of pregnancy: Secondary | ICD-10-CM

## 2021-04-20 LAB — POCT URINALYSIS DIPSTICK OB
Bilirubin, UA: NEGATIVE
Blood, UA: NEGATIVE
Glucose, UA: NEGATIVE
Ketones, UA: NEGATIVE
Leukocytes, UA: NEGATIVE
Nitrite, UA: NEGATIVE
POC,PROTEIN,UA: NEGATIVE
Spec Grav, UA: 1.01 (ref 1.010–1.025)
Urobilinogen, UA: 0.2 E.U./dL
pH, UA: 6.5 (ref 5.0–8.0)

## 2021-04-20 LAB — OB RESULTS CONSOLE GC/CHLAMYDIA: Gonorrhea: NEGATIVE

## 2021-04-20 NOTE — Progress Notes (Signed)
ROB: No complaints-occasional Braxton Hicks.  Reports daily fetal movement.  Signs and symptoms of labor discussed.  GC/CT-GBS performed.

## 2021-04-22 LAB — STREP GP B NAA: Strep Gp B NAA: POSITIVE — AB

## 2021-04-22 LAB — GC/CHLAMYDIA PROBE AMP
Chlamydia trachomatis, NAA: NEGATIVE
Neisseria Gonorrhoeae by PCR: NEGATIVE

## 2021-04-27 ENCOUNTER — Ambulatory Visit (INDEPENDENT_AMBULATORY_CARE_PROVIDER_SITE_OTHER): Payer: 59 | Admitting: Obstetrics and Gynecology

## 2021-04-27 ENCOUNTER — Other Ambulatory Visit: Payer: Self-pay

## 2021-04-27 ENCOUNTER — Encounter: Payer: Self-pay | Admitting: Obstetrics and Gynecology

## 2021-04-27 VITALS — BP 117/83 | HR 89 | Wt 171.1 lb

## 2021-04-27 DIAGNOSIS — O99820 Streptococcus B carrier state complicating pregnancy: Secondary | ICD-10-CM | POA: Insufficient documentation

## 2021-04-27 DIAGNOSIS — Z3403 Encounter for supervision of normal first pregnancy, third trimester: Secondary | ICD-10-CM

## 2021-04-27 DIAGNOSIS — Z3A37 37 weeks gestation of pregnancy: Secondary | ICD-10-CM

## 2021-04-27 LAB — POCT URINALYSIS DIPSTICK OB
Bilirubin, UA: NEGATIVE
Blood, UA: NEGATIVE
Glucose, UA: NEGATIVE
Ketones, UA: NEGATIVE
Nitrite, UA: NEGATIVE
POC,PROTEIN,UA: NEGATIVE
Spec Grav, UA: 1.01 (ref 1.010–1.025)
Urobilinogen, UA: 0.2 E.U./dL
pH, UA: 7.5 (ref 5.0–8.0)

## 2021-04-27 NOTE — Patient Instructions (Signed)

## 2021-04-27 NOTE — Progress Notes (Signed)
ROB: Notes some pelvic pressure. Discussed GBS positive status, and need for antibiotics in labor.  Discussed visitor policy.  RTC in 1 week. Labor precautions.

## 2021-04-27 NOTE — Progress Notes (Signed)
OB-pt present for routine prenatal care. Pt stated having abd/pelvic area pain and braxton hick contractions.

## 2021-05-04 ENCOUNTER — Encounter: Payer: Self-pay | Admitting: Obstetrics and Gynecology

## 2021-05-04 ENCOUNTER — Other Ambulatory Visit: Payer: Self-pay

## 2021-05-04 ENCOUNTER — Ambulatory Visit (INDEPENDENT_AMBULATORY_CARE_PROVIDER_SITE_OTHER): Payer: 59 | Admitting: Obstetrics and Gynecology

## 2021-05-04 VITALS — BP 126/88 | HR 86 | Wt 171.8 lb

## 2021-05-04 DIAGNOSIS — Z3A38 38 weeks gestation of pregnancy: Secondary | ICD-10-CM

## 2021-05-04 DIAGNOSIS — Z3403 Encounter for supervision of normal first pregnancy, third trimester: Secondary | ICD-10-CM

## 2021-05-04 LAB — POCT URINALYSIS DIPSTICK OB
Bilirubin, UA: NEGATIVE
Blood, UA: NEGATIVE
Glucose, UA: NEGATIVE
Ketones, UA: NEGATIVE
Leukocytes, UA: NEGATIVE
Nitrite, UA: NEGATIVE
POC,PROTEIN,UA: NEGATIVE
Spec Grav, UA: 1.01 (ref 1.010–1.025)
Urobilinogen, UA: 0.2 E.U./dL
pH, UA: 6.5 (ref 5.0–8.0)

## 2021-05-04 NOTE — Progress Notes (Signed)
ROB: No complaints.  Denies contractions.  Signs and symptoms of labor discussed.

## 2021-05-05 ENCOUNTER — Observation Stay (HOSPITAL_BASED_OUTPATIENT_CLINIC_OR_DEPARTMENT_OTHER)
Admission: EM | Admit: 2021-05-05 | Discharge: 2021-05-05 | Disposition: A | Discharge status: Home / Self Care | Payer: 59 | Source: Home / Self Care | Admitting: Obstetrics and Gynecology

## 2021-05-05 ENCOUNTER — Encounter: Payer: Self-pay | Admitting: Obstetrics and Gynecology

## 2021-05-05 DIAGNOSIS — Z3490 Encounter for supervision of normal pregnancy, unspecified, unspecified trimester: Secondary | ICD-10-CM

## 2021-05-05 DIAGNOSIS — O368331 Maternal care for abnormalities of the fetal heart rate or rhythm, third trimester, fetus 1: Secondary | ICD-10-CM | POA: Insufficient documentation

## 2021-05-05 DIAGNOSIS — O26813 Pregnancy related exhaustion and fatigue, third trimester: Secondary | ICD-10-CM | POA: Diagnosis not present

## 2021-05-05 DIAGNOSIS — Z3A38 38 weeks gestation of pregnancy: Secondary | ICD-10-CM | POA: Insufficient documentation

## 2021-05-05 NOTE — Discharge Summary (Signed)
    L&D OB Triage Note  SUBJECTIVE Renee Hart is a 30 y.o. G1P0000 female at [redacted]w[redacted]d, EDD Estimated Date of Delivery: 05/15/21 who presented to triage with complaints of contractions.   OB History  Gravida Para Term Preterm AB Living  1 0 0 0 0 0  SAB IAB Ectopic Multiple Live Births  0 0 0 0 0    # Outcome Date GA Lbr Len/2nd Weight Sex Delivery Anes PTL Lv  1 Current             No medications prior to admission.     OBJECTIVE  Nursing Evaluation:   BP 123/90   Pulse 97   Temp 98.3 F (36.8 C) (Oral)   Resp 16   LMP 07/28/2020    Findings:        Having irregular contractions but no cervical change     Not an active labor (possible prodromal labor)  NST was performed and has been reviewed by me.  NST INTERPRETATION: Category I  Mode: External Baseline Rate (A): 130 bpm Variability: Moderate Accelerations: 15 x 15 Decelerations: None     Contraction Frequency (min): 3-6  ASSESSMENT Impression:  1.  Pregnancy:  G1P0000 at [redacted]w[redacted]d , EDD Estimated Date of Delivery: 05/15/21 2.  Reassuring fetal and maternal status 3.  Prodromal labor  PLAN 1. Discussed current condition and above findings with patient and reassurance given.  2. Discharge home with standard labor precautions given to return to L&D or call the office for problems. 3. Continue routine prenatal care.

## 2021-05-05 NOTE — OB Triage Note (Signed)
Patient arrived to unit wheeled by ED for [facility] triage with complaints of contractions. Patient reports she has some pink tinged vaginal discharge since yesterday's outpatient OB visit and cervical exam. Patient reports active fetal movement. Patient reports contractions pain 7/10. Patient history reviewed. Visitation policy reviewed and patient acknowledged understanding of visitation guidelines. Patient oriented to care environment. Patient placed on EFM and TOCO to non tender area of abdomen. Primary nurse Cannon Kettle at bedside to assess cervical dilation. I, RN reviewed medical history and assisted with triage. Will notify provider of patient's arrival.

## 2021-05-05 NOTE — OB Triage Note (Signed)
Notified Dr. Amalia Hailey by phone of patient's arrival and chief complaint of contractions and some slight pink vaginal discharge since being checked on yesterday 05/04/2021. Provider stated to monitor patient and re-check cervix in 2 hours. Will notify patient on plan of care.

## 2021-05-06 ENCOUNTER — Other Ambulatory Visit: Payer: Self-pay

## 2021-05-06 ENCOUNTER — Inpatient Hospital Stay: Payer: 59 | Admitting: Anesthesiology

## 2021-05-06 ENCOUNTER — Inpatient Hospital Stay
Admission: EM | Admit: 2021-05-06 | Discharge: 2021-05-08 | DRG: 788 | Disposition: A | Discharge status: Home / Self Care | Payer: 59 | Attending: Obstetrics and Gynecology | Admitting: Obstetrics and Gynecology

## 2021-05-06 ENCOUNTER — Encounter: Payer: Self-pay | Admitting: Obstetrics and Gynecology

## 2021-05-06 DIAGNOSIS — O4290 Premature rupture of membranes, unspecified as to length of time between rupture and onset of labor, unspecified weeks of gestation: Secondary | ICD-10-CM | POA: Diagnosis present

## 2021-05-06 DIAGNOSIS — O98813 Other maternal infectious and parasitic diseases complicating pregnancy, third trimester: Secondary | ICD-10-CM | POA: Diagnosis not present

## 2021-05-06 DIAGNOSIS — O99824 Streptococcus B carrier state complicating childbirth: Secondary | ICD-10-CM | POA: Diagnosis not present

## 2021-05-06 DIAGNOSIS — Z20822 Contact with and (suspected) exposure to covid-19: Secondary | ICD-10-CM | POA: Diagnosis present

## 2021-05-06 DIAGNOSIS — R1084 Generalized abdominal pain: Secondary | ICD-10-CM | POA: Diagnosis not present

## 2021-05-06 DIAGNOSIS — O4292 Full-term premature rupture of membranes, unspecified as to length of time between rupture and onset of labor: Principal | ICD-10-CM | POA: Diagnosis present

## 2021-05-06 DIAGNOSIS — G8918 Other acute postprocedural pain: Secondary | ICD-10-CM | POA: Diagnosis not present

## 2021-05-06 DIAGNOSIS — O4202 Full-term premature rupture of membranes, onset of labor within 24 hours of rupture: Secondary | ICD-10-CM | POA: Diagnosis not present

## 2021-05-06 DIAGNOSIS — Z3A38 38 weeks gestation of pregnancy: Secondary | ICD-10-CM

## 2021-05-06 DIAGNOSIS — Z3A34 34 weeks gestation of pregnancy: Secondary | ICD-10-CM | POA: Diagnosis not present

## 2021-05-06 DIAGNOSIS — O9081 Anemia of the puerperium: Secondary | ICD-10-CM | POA: Diagnosis not present

## 2021-05-06 DIAGNOSIS — B951 Streptococcus, group B, as the cause of diseases classified elsewhere: Secondary | ICD-10-CM | POA: Diagnosis not present

## 2021-05-06 LAB — CBC
HCT: 33.9 % — ABNORMAL LOW (ref 36.0–46.0)
Hemoglobin: 10.9 g/dL — ABNORMAL LOW (ref 12.0–15.0)
MCH: 28.6 pg (ref 26.0–34.0)
MCHC: 32.2 g/dL (ref 30.0–36.0)
MCV: 89 fL (ref 80.0–100.0)
Platelets: 238 10*3/uL (ref 150–400)
RBC: 3.81 MIL/uL — ABNORMAL LOW (ref 3.87–5.11)
RDW: 13.4 % (ref 11.5–15.5)
WBC: 15.6 10*3/uL — ABNORMAL HIGH (ref 4.0–10.5)
nRBC: 0 % (ref 0.0–0.2)

## 2021-05-06 LAB — RUPTURE OF MEMBRANE (ROM)PLUS: Rom Plus: NEGATIVE

## 2021-05-06 LAB — TYPE AND SCREEN
ABO/RH(D): O POS
Antibody Screen: NEGATIVE

## 2021-05-06 LAB — RESP PANEL BY RT-PCR (FLU A&B, COVID) ARPGX2
Influenza A by PCR: NEGATIVE
Influenza B by PCR: NEGATIVE
SARS Coronavirus 2 by RT PCR: NEGATIVE

## 2021-05-06 LAB — RPR: RPR Ser Ql: NONREACTIVE

## 2021-05-06 MED ORDER — TERBUTALINE SULFATE 1 MG/ML IJ SOLN
0.2500 mg | Freq: Once | INTRAMUSCULAR | Status: DC | PRN
Start: 2021-05-06 — End: 2021-05-07

## 2021-05-06 MED ORDER — FENTANYL 2.5 MCG/ML W/ROPIVACAINE 0.15% IN NS 100 ML EPIDURAL (ARMC)
12.0000 mL/h | EPIDURAL | Status: DC
  Administered 2021-05-06: 12 mL/h via EPIDURAL
  Filled 2021-05-06: qty 100

## 2021-05-06 MED ORDER — OXYTOCIN-SODIUM CHLORIDE 30-0.9 UT/500ML-% IV SOLN
2.5000 [IU]/h | INTRAVENOUS | Status: DC
  Administered 2021-05-07: 2.5 [IU]/h via INTRAVENOUS
  Filled 2021-05-06: qty 1000

## 2021-05-06 MED ORDER — EPHEDRINE 5 MG/ML INJ: 10.0000 mg | INTRAVENOUS | Status: DC | PRN

## 2021-05-06 MED ORDER — OXYTOCIN 10 UNIT/ML IJ SOLN
INTRAMUSCULAR | Status: AC
  Filled 2021-05-06: qty 2

## 2021-05-06 MED ORDER — OXYTOCIN-SODIUM CHLORIDE 30-0.9 UT/500ML-% IV SOLN
1.0000 m[IU]/min | INTRAVENOUS | Status: DC
  Administered 2021-05-06: 1 m[IU]/min via INTRAVENOUS

## 2021-05-06 MED ORDER — SODIUM CHLORIDE 0.9 % IV SOLN
1.0000 g | INTRAVENOUS | Status: DC
  Administered 2021-05-06 (×3): 1 g via INTRAVENOUS
  Filled 2021-05-06 (×3): qty 1000

## 2021-05-06 MED ORDER — AMMONIA AROMATIC IN INHA
RESPIRATORY_TRACT | Status: AC
  Filled 2021-05-06: qty 10

## 2021-05-06 MED ORDER — BUPIVACAINE HCL (PF) 0.25 % IJ SOLN
INTRAMUSCULAR | Status: DC | PRN
  Administered 2021-05-06 (×2): 4 mL via EPIDURAL

## 2021-05-06 MED ORDER — PHENYLEPHRINE 40 MCG/ML (10ML) SYRINGE FOR IV PUSH (FOR BLOOD PRESSURE SUPPORT): 80.0000 ug | PREFILLED_SYRINGE | INTRAVENOUS | Status: DC | PRN

## 2021-05-06 MED ORDER — CALCIUM CARBONATE ANTACID 500 MG PO CHEW: 4.0000 | CHEWABLE_TABLET | Freq: Once | ORAL | Status: AC

## 2021-05-06 MED ORDER — LACTATED RINGERS IV SOLN
500.0000 mL | Freq: Once | INTRAVENOUS | Status: AC
  Administered 2021-05-06: 500 mL via INTRAVENOUS

## 2021-05-06 MED ORDER — MISOPROSTOL 50MCG HALF TABLET: 50.0000 ug | ORAL_TABLET | ORAL | Status: AC

## 2021-05-06 MED ORDER — LACTATED RINGERS IV SOLN: INTRAVENOUS | Status: DC

## 2021-05-06 MED ORDER — ACETAMINOPHEN 325 MG PO TABS: 650.0000 mg | ORAL_TABLET | ORAL | Status: DC | PRN

## 2021-05-06 MED ORDER — MISOPROSTOL 200 MCG PO TABS
ORAL_TABLET | ORAL | Status: AC
  Filled 2021-05-06: qty 4

## 2021-05-06 MED ORDER — OXYTOCIN BOLUS FROM INFUSION: 333.0000 mL | Freq: Once | INTRAVENOUS | Status: DC

## 2021-05-06 MED ORDER — LACTATED RINGERS IV SOLN
500.0000 mL | INTRAVENOUS | Status: DC | PRN
  Administered 2021-05-06: 500 mL via INTRAVENOUS

## 2021-05-06 MED ORDER — BUTORPHANOL TARTRATE 1 MG/ML IJ SOLN
1.0000 mg | INTRAMUSCULAR | Status: DC | PRN
  Administered 2021-05-06 (×3): 1 mg via INTRAVENOUS
  Filled 2021-05-06 (×3): qty 1

## 2021-05-06 MED ORDER — FENTANYL 2.5 MCG/ML W/ROPIVACAINE 0.15% IN NS 100 ML EPIDURAL (ARMC)
EPIDURAL | Status: AC
  Administered 2021-05-06: 12 mL/h via EPIDURAL
  Filled 2021-05-06: qty 100

## 2021-05-06 MED ORDER — SODIUM CHLORIDE 0.9 % IV SOLN
2.0000 g | INTRAVENOUS | Status: AC
  Administered 2021-05-06 (×2): 2 g via INTRAVENOUS
  Filled 2021-05-06 (×2): qty 2000

## 2021-05-06 MED ORDER — DIPHENHYDRAMINE HCL 50 MG/ML IJ SOLN: 12.5000 mg | INTRAMUSCULAR | Status: DC | PRN

## 2021-05-06 MED ORDER — LIDOCAINE HCL (PF) 1 % IJ SOLN
INTRAMUSCULAR | Status: AC
  Filled 2021-05-06: qty 30

## 2021-05-06 MED ORDER — MISOPROSTOL 50MCG HALF TABLET
ORAL_TABLET | ORAL | Status: AC
  Administered 2021-05-06: 50 ug via VAGINAL
  Filled 2021-05-06: qty 1

## 2021-05-06 MED ORDER — LIDOCAINE HCL (PF) 1 % IJ SOLN
INTRAMUSCULAR | Status: DC | PRN
  Administered 2021-05-06: 3 mL via SUBCUTANEOUS

## 2021-05-06 MED ORDER — CALCIUM CARBONATE ANTACID 500 MG PO CHEW
CHEWABLE_TABLET | ORAL | Status: AC
  Administered 2021-05-06: 800 mg via ORAL
  Filled 2021-05-06: qty 4

## 2021-05-06 MED ORDER — SOD CITRATE-CITRIC ACID 500-334 MG/5ML PO SOLN
30.0000 mL | ORAL | Status: DC | PRN
Start: 2021-05-06 — End: 2021-05-07
  Filled 2021-05-06: qty 30

## 2021-05-06 MED ORDER — MISOPROSTOL 25 MCG QUARTER TABLET
ORAL_TABLET | ORAL | Status: AC
  Filled 2021-05-06: qty 1

## 2021-05-06 MED ORDER — PHENYLEPHRINE 40 MCG/ML (10ML) SYRINGE FOR IV PUSH (FOR BLOOD PRESSURE SUPPORT)
80.0000 ug | PREFILLED_SYRINGE | INTRAVENOUS | Status: DC | PRN
Start: 2021-05-06 — End: 2021-05-07

## 2021-05-06 MED ORDER — ONDANSETRON HCL 4 MG/2ML IJ SOLN
4.0000 mg | Freq: Four times a day (QID) | INTRAMUSCULAR | Status: DC | PRN
  Administered 2021-05-06 (×2): 4 mg via INTRAVENOUS
  Filled 2021-05-06 (×2): qty 2

## 2021-05-06 MED ORDER — LIDOCAINE-EPINEPHRINE (PF) 1.5 %-1:200000 IJ SOLN
INTRAMUSCULAR | Status: DC | PRN
  Administered 2021-05-06: 3 mL via EPIDURAL

## 2021-05-06 MED ORDER — LIDOCAINE HCL (PF) 1 % IJ SOLN: 30.0000 mL | INTRAMUSCULAR | Status: DC | PRN

## 2021-05-06 NOTE — H&P (Signed)
History and Physical   HPI  Renee Hart is a 30 y.o. G1P0000 at 50w5dEstimated Date of Delivery: 05/15/21 who is being admitted for PROM at term.  C/o Irregular contractions.  Pt underwent SROM - clear fluid while here in L&D.  OB History  OB History  Gravida Para Term Preterm AB Living  1 0 0 0 0 0  SAB IAB Ectopic Multiple Live Births  0 0 0 0 0    # Outcome Date GA Lbr Len/2nd Weight Sex Delivery Anes PTL Lv  1 Current             PROBLEM LIST  Pregnancy complications or risks: Patient Active Problem List   Diagnosis Date Noted   Labor and delivery indication for care or intervention 05/06/2021   Full-term premature rupture of membranes (PROM) with unknown onset of labor 05/06/2021   Pregnancy 05/05/2021   Group B streptococcal carriage complicating pregnancy [id number]  Gastroesophageal reflux disease 03/30/2018   WEIGHT LOSS 03/10/2009   DIARRHEA 03/10/2009    Prenatal labs and studies: ABO, Rh: --/--/O POS (07/22 0236) Antibody: NEG (07/22 0236) Rubella: 11.40 (12/07 1523) RPR: Non Reactive (05/11 0954)  HBsAg: Negative (12/07 1523)  HIV: Non Reactive (12/07 1523)  [id number] (07/06 1406)   Past Medical History:  Diagnosis Date   Medical history non-contributory      Past Surgical History:  Procedure Laterality Date   KNEE ARTHROSCOPY WITH MEDIAL MENISECTOMY Right 08/28/2018   Procedure: KNEE ARTHROSCOPY WITH MEDIAL OR LATERAL MENISECTOMY;  Surgeon: KThornton Park MD;  Location: [facility] ORS;  Service: Orthopedics;  Laterality: Right;   REFRACTIVE SURGERY       Medications    Current Discharge Medication List     CONTINUE these medications which have NOT CHANGED   Details  Prenatal Vit-Fe Fumarate-FA (MULTIVITAMIN-PRENATAL) 27-0.8 MG TABS tablet Take 1 tablet by mouth daily at 12 noon.         Allergies  Patient has no known allergies.  Review of Systems  Pertinent items noted in HPI and remainder of comprehensive  ROS otherwise negative.  Physical Exam  BP 136/88 (BP Location: Right Arm)   Pulse (!) 115   Temp 98.1 F (36.7 C) (Oral)   Resp 18   Ht '5\' 4"'$  (1.626 m)   Wt 77.9 kg   LMP 07/28/2020   BMI 29.49 kg/m   Lungs:  CTA B Cardio: RRR without M/R/G Abd: Soft, gravid, NT Presentation: cephalic EXT: No C/C/ 1+ Edema DTRs: 2+ B CERVIX: Dilation: 2 Effacement (%): 90, 100 Cervical Position: Middle Station: -2 Presentation: Vertex Exam by:: EAmalia Hailey MD  569m Misoprostol placed  See Prenatal records for more detailed PE.     FHR:  Variability: Good {> 6 bpm)  Toco: Uterine Contractions: Irregular  Test Results  Results for orders placed or performed during the hospital encounter of 05/06/21 (from the past 24 hour(s))  ROM Plus ([place])     Status: None   Collection Time: 05/06/21 12:59 AM  Result Value Ref Range   Rom Plus NEGATIVE   Resp Panel by RT-PCR (Flu A&B, Covid) Nasopharyngeal Swab     Status: None   Collection Time: 05/06/21 12:59 AM   Specimen: Nasopharyngeal Swab; Nasopharyngeal(NP) swabs in vial transport medium  Result Value Ref Range   SARS Coronavirus 2 by RT PCR NEGATIVE NEGATIVE   Influenza A by PCR NEGATIVE NEGATIVE   Influenza B by PCR NEGATIVE NEGATIVE  CBC  Status: Abnormal   Collection Time: 05/06/21  2:36 AM  Result Value Ref Range   WBC 15.6 (H) 4.0 - 10.5 K/uL   RBC 3.81 (L) 3.87 - 5.11 MIL/uL   Hemoglobin 10.9 (L) 12.0 - 15.0 g/dL   HCT 33.9 (L) 36.0 - 46.0 %   MCV 89.0 80.0 - 100.0 fL   MCH 28.6 26.0 - 34.0 pg   MCHC 32.2 30.0 - 36.0 g/dL   RDW 13.4 11.5 - 15.5 %   Platelets 238 150 - 400 K/uL   nRBC 0.0 0.0 - 0.2 %  Type and screen [facility]     Status: None   Collection Time: 05/06/21  2:36 AM  Result Value Ref Range   ABO/RH(D) O POS    Antibody Screen NEG    Sample Expiration      05/09/2021,2359 Performed at [facility], [address] [place], [place] [address]    Group B Strep  positive  Assessment   G1P0000 at 82w5dEstimated Date of Delivery: 05/15/21  The fetus is reassuring.   Patient Active Problem List   Diagnosis Date Noted   Labor and delivery indication for care or intervention 05/06/2021   Full-term premature rupture of membranes (PROM) with unknown onset of labor 05/06/2021   Pregnancy 05/05/2021   Group B streptococcal carriage complicating pregnancy [id number]  Gastroesophageal reflux disease 03/30/2018   WEIGHT LOSS 03/10/2009   DIARRHEA 03/10/2009    Plan  1. Admit to L&D :   2. EFM: -- Category 1 3. Stadol or Epidural if desired.   4. Admission labs  5. Abx for GBS 6. Expect vaginal delivery  DFinis Bud M.D. 05/06/2021 8:01 AM

## 2021-05-06 NOTE — Anesthesia Preprocedure Evaluation (Signed)
Anesthesia Evaluation  Patient identified by MRN, date of birth, ID band Patient awake    Reviewed: Allergy & Precautions, H&P , NPO status , Patient's Chart, lab work & pertinent test results  Airway Mallampati: II  TM Distance: >3 FB Neck ROM: full    Dental no notable dental hx. (+) Teeth Intact   Pulmonary neg pulmonary ROS,    Pulmonary exam normal        Cardiovascular Normal cardiovascular exam     Neuro/Psych negative neurological ROS  negative psych ROS   GI/Hepatic Neg liver ROS, GERD  Medicated and Controlled,  Endo/Other  negative endocrine ROS  Renal/GU negative Renal ROS  negative genitourinary   Musculoskeletal   Abdominal   Peds  Hematology   Anesthesia Other Findings   Reproductive/Obstetrics (+) Pregnancy                             Anesthesia Physical Anesthesia Plan  ASA: 1  Anesthesia Plan: Epidural   Post-op Pain Management:  Regional for Post-op pain   Induction:   PONV Risk Score and Plan:   Airway Management Planned:   Additional Equipment:   Intra-op Plan:   Post-operative Plan:   Informed Consent: I have reviewed the patients History and Physical, chart, labs and discussed the procedure including the risks, benefits and alternatives for the proposed anesthesia with the patient or authorized representative who has indicated his/her understanding and acceptance.     Dental Advisory Given  Plan Discussed with: Anesthesiologist and CRNA  Anesthesia Plan Comments:         Anesthesia Quick Evaluation

## 2021-05-06 NOTE — OB Triage Note (Signed)
Pt is a 30y/o G1P0 at 89w5dwith c/o SROM at 2300, clear fluid, large in amount and ctx since midnight last night q5 min rating 6-7/10 in her back. Pt states +FM. Pt denies VB. Monitors applied and assessing. Initial FHT 130.

## 2021-05-06 NOTE — Progress Notes (Signed)
Intrapartum Progress Note  S: Patient with no complaints. Comfortable with epidural.   O: Blood pressure 115/73, pulse (!) 112, temperature 98.4 F (36.9 C), temperature source Oral, resp. rate 18, height '5\' 4"'$  (1.626 m), weight 77.9 kg, last menstrual period 07/28/2020, SpO2 100 %. Gen App: NAD, comfortable Abdomen: soft, gravid FHT: baseline 135 bpm.  Accels present.  Decels absent. moderate in degree variability.   Tocometer: contractions q 1-3 minutes Cervix: Dilation: 6 Effacement (%): 90 Cervical Position: Middle Station: 0 Presentation: Vertex Exam by:: Pia Mau, RN Extremities: Nontender, no edema.  Pitocin: None  Labs: No new labs   Assessment:  1: SIUP at 75w5d2. PROM since ~ 1:30 a.m. 3. GBS positive  Plan:  1. Continue labor management. Is s/p 1 dose of Cytotec placed at 0800. Has made great progress. Epidural in place. Anticipate vaginal delivery 2. Continue to monitor for s/s of chorioamnionitis.  3. GBS positive, continue treatment with Ampicillin. Has been adequately treated.    CRubie Maid MD [facility]

## 2021-05-06 NOTE — Anesthesia Procedure Notes (Signed)
Epidural Patient location during procedure: OB Start time: 05/06/2021 10:03 AM End time: 05/06/2021 10:15 AM  Staffing Anesthesiologist: Emmie Niemann, MD Resident/CRNA: Hedda Slade, CRNA Performed: resident/CRNA   Preanesthetic Checklist Completed: patient identified, IV checked, site marked, risks and benefits discussed, surgical consent, monitors and equipment checked, pre-op evaluation and timeout performed  Epidural Patient position: sitting Prep: ChloraPrep Patient monitoring: heart rate, continuous pulse ox and blood pressure Approach: midline Location: L3-L4 Injection technique: LOR air  Needle:  Needle type: Tuohy  Needle gauge: 17 G Needle length: 9 cm and 9 Needle insertion depth: 6 cm Catheter type: closed end flexible Catheter size: 19 Gauge Catheter at skin depth: 11 cm Test dose: negative and 1.5% lidocaine with Epi 1:200 K  Assessment Events: blood not aspirated, injection not painful, no injection resistance, no paresthesia and negative IV test  Additional Notes 1 attempt Pt. Evaluated and documentation done after procedure finished. Patient identified. Risks/Benefits/Options discussed with patient including but not limited to bleeding, infection, nerve damage, paralysis, failed block, incomplete pain control, headache, blood pressure changes, nausea, vomiting, reactions to medication both or allergic, itching and postpartum back pain. Confirmed with bedside nurse the patient's most recent platelet count. Confirmed with patient that they are not currently taking any anticoagulation, have any bleeding history or any family history of bleeding disorders. Patient expressed understanding and wished to proceed. All questions were answered. Sterile technique was used throughout the entire procedure. Please see nursing notes for vital signs. Test dose was given through epidural catheter and negative prior to continuing to dose epidural or start infusion. Warning signs  of high block given to the patient including shortness of breath, tingling/numbness in hands, complete motor block, or any concerning symptoms with instructions to call for help. Patient was given instructions on fall risk and not to get out of bed. All questions and concerns addressed with instructions to call with any issues or inadequate analgesia.    Patient tolerated the insertion well without immediate complications.Reason for block:procedure for pain

## 2021-05-06 NOTE — Progress Notes (Signed)
Intrapartum Progress Note  S: Patient doing well, no complaints.   O: Blood pressure 139/81, pulse (!) 121, temperature 98.8 F (37.1 C), temperature source Oral, resp. rate 16, height '5\' 4"'$  (1.626 m), weight 77.9 kg, last menstrual period 07/28/2020, SpO2 96 %. Gen App: NAD, comfortable Abdomen: soft, gravid FHT: baseline 155 bpm.  Accels present.  Decels absent. moderate in degree variability.   Tocometer: contractions q 1-3 minutes, irregular with coulpling Cervix: Dilation: 10 Dilation Complete Date: 05/06/21 Dilation Complete Time: 1928 Effacement (%): 100 Cervical Position: Anterior Station: 0 Presentation: Vertex Exam by:: B Darnell RN Extremities: Nontender, no edema.  Pitocin: 6 mIU  Labs: No new labs   Assessment:  1: SIUP at 79w5d2. PROM since ~ 1:30 a.m. 3. GBS positive  Plan:  1. Began pushing with patient at approximately 8:15 pm.  Large forebag noted, ruptured at start of pushing, clear fluid. Fetal descent noted to +1 station.  Fetal position noted to be ROT.  Pushed for ~ 1 hour with attempts to manually rotate, as well as pushing on left side.  Descent to +2 noted then remained.  Patient then placed in hands and knees for ~ 10 minutes, and now on left lateral side with peanut ball.  Given 4 tums for tetanic contractions and allowed Pitocin break for 30 minutes.  2. Continue to monitor for s/s of chorioamnionitis.  3. GBS positive, continue treatment with Ampicillin. Has been adequately treated.    CRubie Maid MD [facility]

## 2021-05-06 NOTE — Progress Notes (Signed)
Intrapartum Progress Note  S: Patient with no complaints. Is starting to notice more hip and vaginal pressure.    O: Blood pressure 115/74, pulse (!) 117, temperature 98.5 F (36.9 C), temperature source Oral, resp. rate 16, height '5\' 4"'$  (1.626 m), weight 77.9 kg, last menstrual period 07/28/2020, SpO2 96 %. Gen App: NAD, comfortable Abdomen: soft, gravid FHT: baseline 145 bpm.  Accels present.  Decels absent. moderate in degree variability.   Tocometer: contractions q 1-1.5 minutes Cervix: Dilation: 9 Effacement (%): 100 Cervical Position: Anterior Station: 0 Presentation: Vertex Exam by:: LSE Extremities: Nontender, no edema.  Pitocin: 1 mIU  Labs: No new labs   Assessment:  1: SIUP at 47w5d2. PROM since ~ 1:30 a.m. 3. GBS positive  Plan:  1. Continue labor management. Started on low dose pitocin for augmentation. Anticipate vaginal delivery soon. Epidural working well.  2. Continue to monitor for s/s of chorioamnionitis.  3. GBS positive, continue treatment with Ampicillin. Has been adequately treated.    CRubie Maid MD [facility]

## 2021-05-07 ENCOUNTER — Encounter: Payer: Self-pay | Admitting: Obstetrics and Gynecology

## 2021-05-07 ENCOUNTER — Encounter
Admission: EM | Disposition: A | Discharge status: Home / Self Care | Payer: Self-pay | Source: Home / Self Care | Attending: Obstetrics and Gynecology

## 2021-05-07 DIAGNOSIS — Z3A34 34 weeks gestation of pregnancy: Secondary | ICD-10-CM

## 2021-05-07 DIAGNOSIS — B951 Streptococcus, group B, as the cause of diseases classified elsewhere: Secondary | ICD-10-CM

## 2021-05-07 DIAGNOSIS — O4202 Full-term premature rupture of membranes, onset of labor within 24 hours of rupture: Secondary | ICD-10-CM

## 2021-05-07 DIAGNOSIS — O98813 Other maternal infectious and parasitic diseases complicating pregnancy, third trimester: Secondary | ICD-10-CM

## 2021-05-07 SURGERY — Surgical Case
Anesthesia: Epidural

## 2021-05-07 MED ORDER — OXYCODONE HCL 5 MG PO TABS: 5.0000 mg | ORAL_TABLET | ORAL | Status: DC | PRN

## 2021-05-07 MED ORDER — MENTHOL 3 MG MT LOZG
1.0000 | LOZENGE | OROMUCOSAL | Status: DC | PRN
  Filled 2021-05-07: qty 9

## 2021-05-07 MED ORDER — DIBUCAINE (PERIANAL) 1 % EX OINT
1.0000 "application " | TOPICAL_OINTMENT | CUTANEOUS | Status: DC | PRN
  Administered 2021-05-07: 1 via RECTAL
  Filled 2021-05-07: qty 28

## 2021-05-07 MED ORDER — 0.9 % SODIUM CHLORIDE (POUR BTL) OPTIME
TOPICAL | Status: DC | PRN
  Administered 2021-05-07: 1000 mL

## 2021-05-07 MED ORDER — SIMETHICONE 80 MG PO CHEW
80.0000 mg | CHEWABLE_TABLET | ORAL | Status: DC | PRN
  Administered 2021-05-08: 80 mg via ORAL

## 2021-05-07 MED ORDER — GABAPENTIN 300 MG PO CAPS
300.0000 mg | ORAL_CAPSULE | Freq: Two times a day (BID) | ORAL | Status: DC
  Administered 2021-05-07 – 2021-05-08 (×3): 300 mg via ORAL
  Filled 2021-05-07 (×3): qty 1

## 2021-05-07 MED ORDER — FERROUS SULFATE 325 (65 FE) MG PO TABS
325.0000 mg | ORAL_TABLET | ORAL | Status: DC
  Administered 2021-05-08: 325 mg via ORAL
  Filled 2021-05-07: qty 1

## 2021-05-07 MED ORDER — ACETAMINOPHEN 500 MG PO TABS
1000.0000 mg | ORAL_TABLET | Freq: Four times a day (QID) | ORAL | Status: DC
  Administered 2021-05-07 – 2021-05-08 (×4): 1000 mg via ORAL
  Filled 2021-05-07 (×5): qty 2

## 2021-05-07 MED ORDER — DIPHENHYDRAMINE HCL 25 MG PO CAPS: 25.0000 mg | ORAL_CAPSULE | Freq: Four times a day (QID) | ORAL | Status: DC | PRN

## 2021-05-07 MED ORDER — DEXAMETHASONE SODIUM PHOSPHATE 10 MG/ML IJ SOLN
INTRAMUSCULAR | Status: DC | PRN
  Administered 2021-05-07: 10 mg via INTRAVENOUS

## 2021-05-07 MED ORDER — PRENATAL MULTIVITAMIN CH
1.0000 | ORAL_TABLET | Freq: Every day | ORAL | Status: DC
  Administered 2021-05-08: 1 via ORAL
  Filled 2021-05-07 (×2): qty 1

## 2021-05-07 MED ORDER — KETOROLAC TROMETHAMINE 30 MG/ML IJ SOLN
INTRAMUSCULAR | Status: DC | PRN
  Administered 2021-05-07: 30 mg via INTRAVENOUS

## 2021-05-07 MED ORDER — LIDOCAINE HCL (PF) 2 % IJ SOLN
INTRAMUSCULAR | Status: AC
  Filled 2021-05-07: qty 5

## 2021-05-07 MED ORDER — ZOLPIDEM TARTRATE 5 MG PO TABS: 5.0000 mg | ORAL_TABLET | Freq: Every evening | ORAL | Status: DC | PRN

## 2021-05-07 MED ORDER — SODIUM CHLORIDE 0.9 % IV SOLN: 500.0000 mg | INTRAVENOUS | Status: DC

## 2021-05-07 MED ORDER — BUPIVACAINE LIPOSOME 1.3 % IJ SUSP: Freq: Once | INTRAMUSCULAR | Status: DC

## 2021-05-07 MED ORDER — SENNOSIDES-DOCUSATE SODIUM 8.6-50 MG PO TABS
2.0000 | ORAL_TABLET | Freq: Every day | ORAL | Status: DC
  Administered 2021-05-08: 2 via ORAL
  Filled 2021-05-07: qty 2

## 2021-05-07 MED ORDER — LACTATED RINGERS IV SOLN: INTRAVENOUS | Status: DC

## 2021-05-07 MED ORDER — COCONUT OIL OIL
1.0000 "application " | TOPICAL_OIL | Status: DC | PRN
  Administered 2021-05-07: 1 via TOPICAL
  Filled 2021-05-07: qty 120

## 2021-05-07 MED ORDER — MORPHINE SULFATE (PF) 0.5 MG/ML IJ SOLN
INTRAMUSCULAR | Status: DC | PRN
  Administered 2021-05-07 (×2): 2 mg via EPIDURAL

## 2021-05-07 MED ORDER — FENTANYL CITRATE (PF) 100 MCG/2ML IJ SOLN
INTRAMUSCULAR | Status: DC | PRN
  Administered 2021-05-07: 100 ug via EPIDURAL

## 2021-05-07 MED ORDER — SODIUM CHLORIDE (PF) 0.9 % IJ SOLN
INTRAMUSCULAR | Status: AC
  Filled 2021-05-07: qty 50

## 2021-05-07 MED ORDER — CEFAZOLIN SODIUM-DEXTROSE 2-4 GM/100ML-% IV SOLN
2.0000 g | INTRAVENOUS | Status: AC
  Administered 2021-05-07: 2 g via INTRAVENOUS

## 2021-05-07 MED ORDER — BUPIVACAINE HCL 0.5 % IJ SOLN
INTRAMUSCULAR | Status: DC | PRN
  Administered 2021-05-07: 30 mL

## 2021-05-07 MED ORDER — MAGNESIUM HYDROXIDE 400 MG/5ML PO SUSP: 30.0000 mL | ORAL | Status: DC | PRN

## 2021-05-07 MED ORDER — ONDANSETRON HCL 4 MG/2ML IJ SOLN
INTRAMUSCULAR | Status: DC | PRN
  Administered 2021-05-07: 4 mg via INTRAVENOUS

## 2021-05-07 MED ORDER — IBUPROFEN 600 MG PO TABS
600.0000 mg | ORAL_TABLET | Freq: Four times a day (QID) | ORAL | Status: DC
  Administered 2021-05-08 (×2): 600 mg via ORAL
  Filled 2021-05-07 (×2): qty 1

## 2021-05-07 MED ORDER — BUPIVACAINE HCL (PF) 0.5 % IJ SOLN
INTRAMUSCULAR | Status: AC
  Filled 2021-05-07: qty 30

## 2021-05-07 MED ORDER — KETOROLAC TROMETHAMINE 30 MG/ML IJ SOLN: 30.0000 mg | Freq: Once | INTRAMUSCULAR | Status: DC

## 2021-05-07 MED ORDER — SOD CITRATE-CITRIC ACID 500-334 MG/5ML PO SOLN: 30.0000 mL | ORAL | Status: DC

## 2021-05-07 MED ORDER — OXYTOCIN-SODIUM CHLORIDE 30-0.9 UT/500ML-% IV SOLN
2.5000 [IU]/h | INTRAVENOUS | Status: AC
  Administered 2021-05-07: 2.5 [IU]/h via INTRAVENOUS
  Filled 2021-05-07: qty 500

## 2021-05-07 MED ORDER — BUPIVACAINE LIPOSOME 1.3 % IJ SUSP
INTRAMUSCULAR | Status: DC | PRN
  Administered 2021-05-07: 20 mL

## 2021-05-07 MED ORDER — OXYTOCIN-SODIUM CHLORIDE 30-0.9 UT/500ML-% IV SOLN
INTRAVENOUS | Status: DC | PRN
  Administered 2021-05-07: 30 [IU] via INTRAVENOUS

## 2021-05-07 MED ORDER — CEFAZOLIN SODIUM-DEXTROSE 2-4 GM/100ML-% IV SOLN
INTRAVENOUS | Status: AC
  Filled 2021-05-07: qty 100

## 2021-05-07 MED ORDER — KETOROLAC TROMETHAMINE 30 MG/ML IJ SOLN
30.0000 mg | Freq: Four times a day (QID) | INTRAMUSCULAR | Status: AC
  Administered 2021-05-07 (×2): 30 mg via INTRAVENOUS
  Filled 2021-05-07 (×2): qty 1

## 2021-05-07 MED ORDER — MORPHINE SULFATE (PF) 0.5 MG/ML IJ SOLN
INTRAMUSCULAR | Status: AC
  Filled 2021-05-07: qty 10

## 2021-05-07 MED ORDER — WITCH HAZEL-GLYCERIN EX PADS
1.0000 "application " | MEDICATED_PAD | CUTANEOUS | Status: DC | PRN
  Administered 2021-05-07: 1 via TOPICAL
  Filled 2021-05-07: qty 100

## 2021-05-07 MED ORDER — SODIUM CHLORIDE 0.9 % IV SOLN
INTRAVENOUS | Status: DC | PRN
  Administered 2021-05-07: 20 ug/min via INTRAVENOUS

## 2021-05-07 MED ORDER — KETOROLAC TROMETHAMINE 30 MG/ML IJ SOLN
INTRAMUSCULAR | Status: AC
  Filled 2021-05-07: qty 1

## 2021-05-07 MED ORDER — FENTANYL CITRATE (PF) 100 MCG/2ML IJ SOLN
INTRAMUSCULAR | Status: AC
  Filled 2021-05-07: qty 2

## 2021-05-07 MED ORDER — LIDOCAINE HCL (PF) 2 % IJ SOLN
INTRAMUSCULAR | Status: DC | PRN
  Administered 2021-05-07: 40 mg via EPIDURAL
  Administered 2021-05-07: 100 mg via EPIDURAL
  Administered 2021-05-07: 60 mg via EPIDURAL
  Administered 2021-05-07: 200 mg via EPIDURAL

## 2021-05-07 MED ORDER — HYDROMORPHONE HCL 1 MG/ML IJ SOLN
1.0000 mg | INTRAMUSCULAR | Status: DC | PRN
Start: 2021-05-07 — End: 2021-05-08

## 2021-05-07 MED ORDER — BUPIVACAINE LIPOSOME 1.3 % IJ SUSP
INTRAMUSCULAR | Status: AC
  Filled 2021-05-07: qty 20

## 2021-05-07 SURGICAL SUPPLY — 31 items
APL PRP STRL LF DISP 70% ISPRP (MISCELLANEOUS) ×2
BACTOSHIELD CHG 4% 4OZ (MISCELLANEOUS) ×1
BAG COUNTER SPONGE EZ (MISCELLANEOUS) ×2 IMPLANT
BAG SPNG 4X4 CLR HAZ (MISCELLANEOUS) ×1
CHLORAPREP W/TINT 26 (MISCELLANEOUS) ×4 IMPLANT
DRSG TELFA 3X8 NADH (GAUZE/BANDAGES/DRESSINGS) ×2 IMPLANT
ELECT REM PT RETURN 9FT ADLT (ELECTROSURGICAL) ×2
ELECTRODE REM PT RTRN 9FT ADLT (ELECTROSURGICAL) ×1 IMPLANT
EXTRT SYSTEM ALEXIS 17CM (MISCELLANEOUS)
GAUZE SPONGE 4X4 12PLY STRL (GAUZE/BANDAGES/DRESSINGS) ×2 IMPLANT
GLOVE SURG ENC MOIS LTX SZ6.5 (GLOVE) ×2 IMPLANT
GLOVE SURG UNDER LTX SZ7 (GLOVE) ×2 IMPLANT
GOWN STRL REUS W/ TWL LRG LVL3 (GOWN DISPOSABLE) ×2 IMPLANT
GOWN STRL REUS W/TWL LRG LVL3 (GOWN DISPOSABLE) ×4
KIT TURNOVER KIT A (KITS) ×2 IMPLANT
MANIFOLD NEPTUNE II (INSTRUMENTS) ×2 IMPLANT
MAT PREVALON FULL STRYKER (MISCELLANEOUS) ×2 IMPLANT
NS IRRIG 1000ML POUR BTL (IV SOLUTION) ×2 IMPLANT
PACK C SECTION AR (MISCELLANEOUS) ×2 IMPLANT
PAD DRESSING TELFA 3X8 NADH (GAUZE/BANDAGES/DRESSINGS) ×1 IMPLANT
PAD OB MATERNITY 4.3X12.25 (PERSONAL CARE ITEMS) ×2 IMPLANT
PAD PREP 24X41 OB/GYN DISP (PERSONAL CARE ITEMS) ×2 IMPLANT
PENCIL SMOKE EVACUATOR (MISCELLANEOUS) ×2 IMPLANT
RTRCTR C-SECT PINK 25CM LRG (MISCELLANEOUS) ×1 IMPLANT
SCRUB CHG 4% DYNA-HEX 4OZ (MISCELLANEOUS) ×1 IMPLANT
SUT MNCRL AB 4-0 PS2 18 (SUTURE) ×2 IMPLANT
SUT PLAIN 2 0 XLH (SUTURE) IMPLANT
SUT VIC AB 0 CT1 36 (SUTURE) ×8 IMPLANT
SUT VIC AB 3-0 SH 27 (SUTURE) ×2
SUT VIC AB 3-0 SH 27X BRD (SUTURE) ×1 IMPLANT
SYSTEM CONTND EXTRCTN KII BLLN (MISCELLANEOUS) IMPLANT

## 2021-05-07 NOTE — Progress Notes (Addendum)
OB/GYN Progress Note  Called by nurse as patient notes she is beginning to feel more pressure. Resumed pushing at approximately 10:30 pm after several different position changes. ROT position still noted. Continue to push with minimal change. Several more attempts made for manual rotation. Fetal heart tracing remained reassuring, with Category I tracing.  Uterine contractions noted to be spacing, so Pitocin resumed during this time. Attempted to use vacuum assistance once rotation performed. Risks and benefits of the vacuum were explained to the patient and family.  Verbal consent was obtained.  The kiwi bell vacuum was applied. With good maternal effort, the vacuum was utilized to guide descent.  After approximately 15 minutes of pushing with no further descent, the vacuum was removed to reassess fetal positioning.  Fetal positioning again noted to be back in OT to OP position.  Decision was made at this time to discontinue attempts at vaginal delivery and to proceed with C-section delivery.   The risks of surgery were discussed with the patient including but were not limited to: bleeding which may require transfusion or reoperation; infection which may require antibiotics; injury to bowel, bladder, ureters or other surrounding organs; injury to the fetus; need for additional procedures including hysterectomy in the event of a life-threatening hemorrhage; formation of adhesions; placental abnormalities with subsequent pregnancies; incisional problems; thromboembolic phenomenon and other postoperative/anesthesia complications.  The patient concurred with the proposed plan, giving informed written consent for the procedure.   Patient has been NPO except clears since yesterday at ~ 10 a.m. She will remain NPO for procedure. Anesthesia and OR aware. Preoperative prophylactic antibiotics and SCDs ordered on call to the OR.  To OR when ready.   Rubie Maid, MD [facility]

## 2021-05-07 NOTE — Transfer of Care (Signed)
Immediate Anesthesia Transfer of Care Note  Patient: Renee Hart  Procedure(s) Performed: CESAREAN SECTION  Patient Location: PACU  Anesthesia Type:Epidural  Level of Consciousness: awake, alert  and oriented  Airway & Oxygen Therapy: Patient Spontanous Breathing  Post-op Assessment: Report given to RN and Post -op Vital signs reviewed and stable  Post vital signs: Reviewed and stable  Last Vitals:  Vitals Value Taken Time  BP 134/98 05/07/21 0230  Temp 36.1 C 05/07/21 0230  Pulse 109 05/07/21 0230  Resp 17 05/07/21 0230  SpO2 97 % 05/07/21 0230    Last Pain:  Vitals:   05/07/21 0230  TempSrc:   PainSc: 0-No pain      Patients Stated Pain Goal: 0 ([id number])  Complications: No notable events documented.

## 2021-05-07 NOTE — Op Note (Signed)
Cesarean Section Procedure Note  Indications: failure to progress: arrest of descent, fetal malpositioning (occiput transverse)  Pre-operative Diagnosis: 38 week 4 day pregnancy, premature rupture of membranes at term, GBS positive, fetal malpoisition (ROT).  Post-operative Diagnosis: same  Surgeon: Rubie Maid, MD  Assistants:    Surgical scrub assist.    Procedure: Primary low transverse Cesarean Section  Anesthesia: Epidural anesthesia  Findings: Female infant, cephalic presentation (OT position), 4210 grams, with Apgar scores of 7 at one minute and 9 at five minutes. Intact placenta with 3 vessel cord.  Small amount of light meconium stained amniotic fluid at hysterotomy The uterine outline, tubes and ovaries appeared normal.   Procedure Details: The patient was seen in the Holding Room. The risks, benefits, complications, treatment options, and expected outcomes were discussed with the patient.  The patient concurred with the proposed plan, giving informed consent.  The site of surgery properly noted/marked. The patient was taken to the Operating Room, identified as Renee Hart and the procedure verified as C-Section Delivery.   After dosing of her epidural to a surgical level, the patient was draped and prepped in the usual sterile manner. Anesthesia was tested and noted to be adequate. A Time Out was held and the above information confirmed.  A Pfannenstiel incision was made and carried down through the subcutaneous tissue to the fascia. Fascial incision was made and extended transversely. The fascia was separated from the underlying rectus tissue superiorly and inferiorly. The peritoneum was identified and entered. Peritoneal incision was extended longitudinally. The surgical assist was able to provide retraction to allow for clear visualization of surgical site. The utero-vesical peritoneal reflection was incised transversely and the bladder flap was bluntly freed from the  lower uterine segment. A low transverse uterine incision was made. Delivered from cephalic (LOT) presentation was a 4210 gram Female with Apgar scores of 7 at one minute and 9 at five minutes.  The assistant was able to apply adequate fundal pressure to allow for successful delivery of the fetus. After the umbilical cord was clamped and cut cord blood was obtained for evaluation. The placenta was removed intact and appeared normal. The uterus was exteriorized and cleared of all clots and debris. The uterine outline, tubes and ovaries appeared normal.  The uterine incision was closed with running locked sutures of 0-Vicryl.  A second suture of 0-Vicryl was used in an imbricating layer.  A figure-of-eight stitch was placed at the left lateral edge of the uterine incision to achieve hemostasis. Hemostasis was observed. The uterus was returned to the abdomen.  The pericolic gutters were cleared of all clots and debris.  The fascia was grasped with Kocher clamps and injected with 60 ml of 1.3% Exparel solution. The fascia was then reapproximated with a running suture of 0-Vicryl. The skin was reapproximated with 4-0 Monocryl.  The skin was then injected with the remaining 35 ml of 1.3% Exparel solution. A pressure bandage was then placed.   Instrument, sponge, and needle counts were correct prior the abdominal closure and at the conclusion of the case.    Estimated Blood Loss:  526 ml      Drains: foley catheter to gravity drainage with blood tinged urine to start, 125 ml of blood-tinged urine at end of the procedure         Total IV Fluids:  1300 ml  Specimens: None         Implants: None         Complications:  None; patient tolerated the procedure well.         Disposition: PACU - hemodynamically stable.         Condition: Stable   Rubie Maid, MD [facility]

## 2021-05-08 LAB — CBC
HCT: 24.2 % — ABNORMAL LOW (ref 36.0–46.0)
Hemoglobin: 7.8 g/dL — ABNORMAL LOW (ref 12.0–15.0)
MCH: 29.2 pg (ref 26.0–34.0)
MCHC: 32.2 g/dL (ref 30.0–36.0)
MCV: 90.6 fL (ref 80.0–100.0)
Platelets: 208 10*3/uL (ref 150–400)
RBC: 2.67 MIL/uL — ABNORMAL LOW (ref 3.87–5.11)
RDW: 13.7 % (ref 11.5–15.5)
WBC: 14.5 10*3/uL — ABNORMAL HIGH (ref 4.0–10.5)
nRBC: 0 % (ref 0.0–0.2)

## 2021-05-08 MED ORDER — IBUPROFEN 600 MG PO TABS: 600.0000 mg | ORAL_TABLET | Freq: Four times a day (QID) | ORAL | 0 refills | Status: DC

## 2021-05-08 MED ORDER — TRAMADOL HCL 50 MG PO TABS: 50.0000 mg | ORAL_TABLET | Freq: Four times a day (QID) | ORAL | 0 refills | Status: DC | PRN

## 2021-05-08 MED ORDER — DOCUSATE SODIUM 100 MG PO CAPS: 100.0000 mg | ORAL_CAPSULE | Freq: Two times a day (BID) | ORAL | 2 refills | Status: DC | PRN

## 2021-05-08 NOTE — Anesthesia Postprocedure Evaluation (Signed)
Anesthesia Post Note  Patient: Renee Hart  Procedure(s) Performed: [place]  Patient location during evaluation: Mother Baby Anesthesia Type: Epidural Level of consciousness: awake and oriented Pain management: pain level controlled Vital Signs Assessment: post-procedure vital signs reviewed and stable Respiratory status: spontaneous breathing Cardiovascular status: blood pressure returned to baseline Postop Assessment: no headache and no backache Anesthetic complications: no   No notable events documented.   Last Vitals:  Vitals:   05/07/21 2036 05/08/21 0433  BP: 106/77 98/64  Pulse: (!) 105 88  Resp: 18 17  Temp: (!) 36.4 C 36.6 C  SpO2: 98% 98%    Last Pain:  Vitals:   05/08/21 0623  TempSrc:   PainSc: [place]

## 2021-05-08 NOTE — Progress Notes (Signed)
Mother discharged.  Discharge instructions given.  Mother verbalizes understanding.  Transported by auxiliary.

## 2021-05-08 NOTE — Anesthesia Post-op Follow-up Note (Signed)
  Anesthesia Pain Follow-up Note  Patient: Renee Hart  Day #: 2  Date of Follow-up: 05/08/2021 Time: 8:59 AM  Last Vitals:  Vitals:   05/07/21 2036 05/08/21 0433  BP: 106/77 98/64  Pulse: (!) 105 88  Resp: 18 17  Temp: (!) 36.4 C 36.6 C  SpO2: 98% 98%    Level of Consciousness: alert  Pain: none   Side Effects:None  Catheter Site Exam:clean     Plan: D/C from anesthesia care at surgeon's request  Milinda Pointer

## 2021-05-08 NOTE — Progress Notes (Signed)
Postpartum Day # 1: Cesarean Delivery  Subjective: Patient reports tolerating PO, + flatus, and no problems voiding.  Pain is well controlled.   Objective: Vital signs in last 24 hours: Temp:  [97.4 F (36.3 C)-97.9 F (36.6 C)] 97.4 F (36.3 C) (07/24 0910) Pulse Rate:  [85-105] 85 (07/24 0910) Resp:  [17-18] 18 (07/24 0910) BP: (98-124)/(64-91) 112/91 (07/24 0910) SpO2:  [98 %-99 %] 98 % (07/24 0910)  Physical Exam:  General: alert and no distress Lungs: clear to auscultation bilaterally Breasts: normal appearance, no masses or tenderness Heart: regular rate and rhythm, S1, S2 normal, no murmur, click, rub or gallop Abdomen: soft, non-tender; bowel sounds normal; no masses,  no organomegaly Pelvis: Lochia appropriate, Uterine Fundus firm, Incision: healing well, no significant drainage, no dehiscence, no significant erythema Extremities: DVT Evaluation: No evidence of DVT seen on physical exam. Negative Homan's sign. No cords or calf tenderness. No significant calf/ankle edema.   Recent Labs    05/06/21 0236 05/08/21 0435  HGB 10.9* 7.8*  HCT 33.9* 24.2*    Assessment/Plan: Status post Cesarean section. Doing well postoperatively.  Breastfeeding, doing well.  Circumcision prior to discharge. Contraception undecided. Regular diet as tolerated. Continue PO pain management.  Moderate postpartum anemia noted, however patient remains asymptomatic. Can treat with PO iron supplementation.  Continue current care.  Is possible to go home later today. Or can d/c home in a.m.   Rubie Maid, MD [facility]

## 2021-05-08 NOTE — Discharge Summary (Signed)
Postpartum Discharge Summary      Patient Name: Renee Hart DOB: October 19, 1990 MRN: [id number]  Date of admission: 05/06/2021 Delivery date:05/07/2021  Delivering provider: Rubie Maid  Date of discharge: 05/08/2021  Admitting diagnosis: Labor and delivery indication for care or intervention [O75.9] Full-term premature rupture of membranes (PROM) with unknown onset of labor [O42.90] Intrauterine pregnancy: [redacted]w[redacted]d    Secondary diagnosis:  Active Problems:   Labor and delivery indication for care or intervention   Full-term premature rupture of membranes (PROM) with unknown onset of labor  Additional problems: GBS positive status, anemia of pregnancy   Discharge diagnosis: Term Pregnancy Delivered and Anemia                                              Post partum procedures: None Augmentation: Pitocin and Cytotec Complications: None  Hospital course: Induction of Labor With Cesarean Section   30y.o. yo G1P1001 at 31w6das admitted to the hospital 05/06/2021 for induction of labor due to term PROM. Patient had a normal labor course. The patient went for cesarean section due to Arrest of Descent. Delivery details are as follows: Membrane Rupture Time/Date: 11:00 PM ,05/05/2021   Delivery Method:C-Section, Low Transverse  Details of operation can be found in separate operative Note.  Patient had an uncomplicated postpartum course. She is ambulating, tolerating a regular diet, passing flatus, and urinating well.  Patient is discharged home in stable condition on 05/08/21.      Newborn Data: Birth date:05/07/2021  Birth time:1:33 AM  Gender:Female  Living status:Living  Apgars:7 ,9  Weight:4210 g                                Magnesium Sulfate received: No BMZ received: No Rhophylac:No MMR:No T-DaP:Given prenatally Flu: No Transfusion:No  Physical exam  Vitals:   05/07/21 1534 05/07/21 2036 05/08/21 0433 05/08/21 0910  BP: 124/85 106/77 98/64 (!) 112/91  Pulse: (!)  102 (!) 105 88 85  Resp: 18 18 17 18   Temp: 97.9 F (36.6 C) (!) 97.5 F (36.4 C) 97.8 F (36.6 C) (!) 97.4 F (36.3 C)  TempSrc: Oral Oral Oral Oral  SpO2: 99% 98% 98% 98%  Weight:      Height:       General: alert, cooperative, and no distress Lochia: appropriate Uterine Fundus: firm Incision: Healing well with no significant drainage, No significant erythema, Dressing is clean, dry, and intact DVT Evaluation: No evidence of DVT seen on physical exam.  Negative Homan's sign. No cords or calf tenderness.  No significant calf/ankle edema.    Labs: Lab Results  Component Value Date   WBC 14.5 (H) 05/08/2021   HGB 7.8 (L) 05/08/2021   HCT 24.2 (L) 05/08/2021   MCV 90.6 05/08/2021   PLT 208 05/08/2021   CMP Latest Ref Rng & Units 03/17/2020  Glucose 65 - 99 mg/dL 101(H)  BUN 6 - 20 mg/dL 8  Creatinine 0.57 - 1.00 mg/dL 0.79  Sodium 134 - 144 mmol/L 136  Potassium 3.5 - 5.2 mmol/L 4.0  Chloride 96 - 106 mmol/L 99  CO2 20 - 29 mmol/L 24  Calcium 8.7 - 10.2 mg/dL 9.6  Total Protein 6.0 - 8.5 g/dL 7.0  Total Bilirubin 0.0 - 1.2 mg/dL 0.4  Alkaline Phos 48 -  121 IU/L 70  AST 0 - 40 IU/L 21  ALT 0 - 32 IU/L 12   Edinburgh Score: Edinburgh Postnatal Depression Scale Screening Tool 05/07/2021  I have been able to laugh and see the funny side of things. 0  I have looked forward with enjoyment to things. 0  I have blamed myself unnecessarily when things went wrong. 1  I have been anxious or worried for no good reason. 0  I have felt scared or panicky for no good reason. 0  Things have been getting on top of me. 0  I have been so unhappy that I have had difficulty sleeping. 0  I have felt sad or miserable. 0  I have been so unhappy that I have been crying. 0  The thought of harming myself has occurred to me. 0  Edinburgh Postnatal Depression Scale Total 1      After visit meds:  Allergies as of 05/08/2021   No Known Allergies      Medication List     TAKE these  medications    docusate sodium 100 MG capsule Commonly known as: COLACE Take 1 capsule (100 mg total) by mouth 2 (two) times daily as needed.   ibuprofen 600 MG tablet Commonly known as: ADVIL Take 1 tablet (600 mg total) by mouth every 6 (six) hours.   multivitamin-prenatal 27-0.8 MG Tabs tablet Take 1 tablet by mouth daily at 12 noon.   traMADol 50 MG tablet Commonly known as: Ultram Take 1 tablet (50 mg total) by mouth every 6 (six) hours as needed for moderate pain or severe pain (May take up to 2 tablets for severe pain every 6 hrs).         Discharge home in stable condition Infant Feeding: Breast Infant Disposition:home with mother Discharge instruction: per After Visit Summary and Postpartum booklet. Activity: Advance as tolerated. Pelvic rest for 6 weeks.  Diet: routine diet Anticipated Birth Control: Unsure Postpartum Appointment:6 weeks Additional Postpartum F/U: Incision check 5-7 days Future Appointments: Future Appointments  Date Time Provider [place]  05/11/2021 10:30 AM Rubie Maid, MD [facility] None   Follow up Visit:  Follow-up Information     Rubie Maid, MD Follow up.   Specialties: Obstetrics and Gynecology, Radiology Why: Patient has appointment scheduled on Wednesday. For incision check. Contact information: [address] [place] [address] [phone]                     05/08/2021 Rubie Maid, MD

## 2021-05-08 NOTE — Discharge Instructions (Signed)

## 2021-05-09 ENCOUNTER — Encounter: Payer: Self-pay | Admitting: Obstetrics and Gynecology

## 2021-05-10 NOTE — Progress Notes (Addendum)
    OBSTETRICS/GYNECOLOGY POST-OPERATIVE CLINIC VISIT  Subjective:     Renee Hart is a 30 y.o. G39P1001 female who presents to the clinic 1 week status post primary low-transverse Cesarean Section for  arrest of descent . Eating a regular diet with difficulty. Bowel movements are normal. Pain is controlled with current analgesics. Medications being used: prescription NSAID's including Ibuprofen . Patient is breastfeeding and pumping; pt having nipple soreness and cracking, using coconut oil which helps. Vaginal bleeding has decreased and would like to have the Nexplanon for birth control.   The following portions of the patient's history were reviewed and updated as appropriate: allergies, current medications, past family history, past medical history, past social history, past surgical history, and problem list.  Review of Systems Pertinent items noted in HPI and remainder of comprehensive ROS otherwise negative.   Objective:   Height 7f 4 inches Weight 160 lb Blood pressure 126/83 Pulse 79  General:  alert and no distress  Breast:  lactating, no erythema or tenderness, nipples normal.  Abdomen: soft, bowel sounds active, non-tender  Incision:   healing well, no drainage, no erythema, no hernia, no seroma, no swelling, no dehiscence, incision well approximated. Honeycomb dressing in place, clean/dry/intact.  Extremities: +1 pitting edema of feet and ankles bilaterally    Pathology:  None  Assessment:   Patient s/p primary low-transverse C-section, Doing well postoperatively. Bilateral lower extremity edema Lactating mother  Plan:   1. Continue any current medications as instructed by provider. 2. Wound care discussed. To remove bandage in 2 days 3. Discussed elevation of feet, use of compression stockings for edema. 4. Reviewed breastfeeding, tips for a good latch. Continue coconut oil. Given warning signs/symptoms for mastitis 5. Activity restrictions: no driving  and no overhead lifting 6. Anticipated return to work:  6-8 weeks . 7. Follow up: 5 weeks for post partum care.     CRubie Maid MD [facility]

## 2021-05-11 ENCOUNTER — Ambulatory Visit (INDEPENDENT_AMBULATORY_CARE_PROVIDER_SITE_OTHER): Payer: 59 | Admitting: Obstetrics and Gynecology

## 2021-05-11 ENCOUNTER — Other Ambulatory Visit: Payer: Self-pay

## 2021-05-11 DIAGNOSIS — Z98891 History of uterine scar from previous surgery: Secondary | ICD-10-CM

## 2021-05-11 DIAGNOSIS — R600 Localized edema: Secondary | ICD-10-CM

## 2021-05-11 DIAGNOSIS — Z09 Encounter for follow-up examination after completed treatment for conditions other than malignant neoplasm: Secondary | ICD-10-CM

## 2021-05-11 NOTE — Patient Instructions (Addendum)
Breastfeeding Tips for a Good Latch Breastfeeding can be challenging, especially during the first few weeks after childbirth. It is normal to have some problems when you start to breastfeed your new baby, even if you have breastfed before. Latching is an important part of having a good breastfeeding experience. This refers to how your baby's mouth attaches to your nipple to breastfeed. Your baby may have trouble latching due to: Poor positioning. Nipple confusion. This can occur if you introduce a bottle or pacifier too early. Abnormalities in your baby's mouth, tongue, or lips. This includes conditions like tongue-tie or cleft lip. The shape of your nipples, such as nipples that are flat or turned in (inverted). Your baby being born early (prematurely). Small babies often have a weak suck. Breasts becoming overfilled with milk (engorged breasts). Breasts that are too full can make a latch difficult. If breasts are engorged, express a little milk to help soften the breast. Work with a breastfeeding specialist (Science writer) to find positions and strategies that will help make sure your baby has agood latch. How does this affect me? A poor latch may cause you to have problems such as: Cracked or sore nipples. Engorged breasts. Plugged milk ducts. Low milk supply. Breast inflammation or infection. How does this affect my baby? A poor latch may cause your baby to not be able to feed effectively. As aresult, he or she may have trouble gaining weight. Follow these instructions at home: How to position your baby Find a comfortable place to sit or lie down, with your neck and back well supported. If you are seated, place a pillow or rolled-up blanket under your baby to bring him or her to the level of your breast. Make sure that your baby's belly is facing your abdomen. Try different positions to find one that works best for you and your baby. How to help your baby latch  To begin each  breastfeeding session, you may find it helpful to gently massage your breast. With your fingertips, massage from your chest wall toward your nipple in a circular motion. This encourages milk flow. If your milk flowsslowly, you may need to continue this action during feeding. Position your breast for an ideal latch. Support your breast with four fingers underneath and your thumb above your nipple. Keep your fingers away from yournipple and your baby's mouth. To help your baby latch, follow these steps: Stroke your baby's lips gently with your finger or nipple. When your baby's mouth is open wide enough, quickly bring your baby to your breast and place your entire nipple, and as much of the areolaas possible, into your baby's mouth. The areola is the colored area around your nipple. Your baby's tongue should be between his or her lower gum and your breast. More areola should be visible above your baby's upper lip than below the lower lip. When your baby starts sucking, you will feel a gentle pull on your nipple, but you should not feel pain. Be patient. It is common for a baby to suck for about 2-3 minutes to start the flow of breast milk. Make sure that your baby's mouth is correctly positioned around your nipple. Your baby's lips should create a seal on your breast and be turned outward (everted).  General instructions Look for the following signs that your baby has successfully latched on to your nipple: The baby is quietly tugging or quietly sucking without causing you pain. You hear the baby swallow after every 3 or 4 sucks.  You see muscle movement above and in front of the baby's ears while he or she is sucking. Be aware of these signs that your baby has not successfully latched on to your nipple: The baby makes sucking sounds or smacking sounds while nursing. You have nipple pain. If your baby is not latched well, insert your little finger between your baby's gums and your nipple to break the  seal. Then try to help your baby latch again. Contact a health care provider if: You have cracking or soreness in your nipples that lasts longer than 1 week. You have nipple pain. Nipple cracking and soreness are common during the first week after birth, but nipple pain is never normal. You have breast engorgement that does not improve after 48-72 hours. You have a plugged milk duct and a fever. You follow suggestions for a good latch but you continue to have problems or concerns. You have pus-like discharge coming from your breast. Your baby is not gaining weight or loses weight. Summary Many common breastfeeding challenges are caused by poor latching. Latching refers to how your baby's mouth attaches to your nipple during breastfeeding. If problems continue, seek help from a lactation consultant in your community. He or she can assess you and your baby for any latching problems. The lactation consultant can work with you to develop a plan to overcome any breastfeeding challenges. This information is not intended to replace advice given to you by your health care provider. Make sure you discuss any questions you have with your healthcare provider. Document Revised: 03/31/2020 Document Reviewed: 03/31/2020 Elsevier Patient Education  [place].

## 2021-05-17 ENCOUNTER — Other Ambulatory Visit: Payer: Self-pay

## 2021-05-17 MED ORDER — DICLOXACILLIN SODIUM 500 MG PO CAPS
500.0000 mg | ORAL_CAPSULE | Freq: Four times a day (QID) | ORAL | 0 refills | Status: AC
  Filled 2021-05-17: qty 40, 10d supply, fill #0

## 2021-05-19 ENCOUNTER — Ambulatory Visit: Payer: 59 | Admitting: Dermatology

## 2021-05-27 DIAGNOSIS — Z760 Encounter for issue of repeat prescription: Secondary | ICD-10-CM | POA: Diagnosis not present

## 2021-06-10 ENCOUNTER — Other Ambulatory Visit: Payer: Self-pay

## 2021-06-10 DIAGNOSIS — O9113 Abscess of breast associated with lactation: Secondary | ICD-10-CM

## 2021-06-10 MED ORDER — DICLOXACILLIN SODIUM 500 MG PO CAPS
500.0000 mg | ORAL_CAPSULE | Freq: Four times a day (QID) | ORAL | 0 refills | Status: DC
  Filled 2021-06-10: qty 40, 10d supply, fill #0

## 2021-06-15 ENCOUNTER — Other Ambulatory Visit: Payer: Self-pay

## 2021-06-15 ENCOUNTER — Encounter: Payer: Self-pay | Admitting: Obstetrics and Gynecology

## 2021-06-15 ENCOUNTER — Ambulatory Visit (INDEPENDENT_AMBULATORY_CARE_PROVIDER_SITE_OTHER): Payer: 59 | Admitting: Obstetrics and Gynecology

## 2021-06-15 DIAGNOSIS — O9081 Anemia of the puerperium: Secondary | ICD-10-CM | POA: Diagnosis not present

## 2021-06-15 DIAGNOSIS — N61 Mastitis without abscess: Secondary | ICD-10-CM

## 2021-06-15 DIAGNOSIS — O9279 Other disorders of lactation: Secondary | ICD-10-CM

## 2021-06-15 MED ORDER — CEPHALEXIN 500 MG PO CAPS
500.0000 mg | ORAL_CAPSULE | Freq: Four times a day (QID) | ORAL | 0 refills | Status: DC
  Filled 2021-06-15: qty 28, 7d supply, fill #0

## 2021-06-15 NOTE — Progress Notes (Signed)
   OBSTETRICS POSTPARTUM CLINIC PROGRESS NOTE  Subjective:     Renee Hart is a 30 y.o. G70P1001 female who presents for a postpartum visit. She is 6 weeks postpartum following a primary low transverse C-section. I have fully reviewed the prenatal and intrapartum course. The delivery was at 69w5dgestational weeks.  Anesthesia: epidural. Postpartum course has been well except notes two bouts of mastitis.  She does admit that she was completely compliant with first round of antibiotics several weeks ago, but has currently resumed new course of Dicloxacillin last week. Baby's course has been well. Baby is feeding by breast (exclusive). Bleeding: patient has not not resumed menses, with No LMP recorded. Bowel function is normal. Bladder function is normal. Patient is not sexually active. Contraception method desired is considering Nexplanon. Postpartum depression screening: negative.  EDPS score is 5.    The following portions of the patient's history were reviewed and updated as appropriate: allergies, current medications, past family history, past medical history, past social history, past surgical history, and problem list.  Review of Systems Pertinent items noted in HPI and remainder of comprehensive ROS otherwise negative.   Objective:    BP 119/86 (BP Location: Left Arm, Patient Position: Sitting, Cuff Size: Normal)   Pulse 81   Ht '5\' 4"'$  (1.626 m)   Wt 143 lb 14.4 oz (65.3 kg)   Breastfeeding Yes   BMI 24.70 kg/m   General:  alert and no distress   Breasts:  inspection negative, no nipple discharge or bleeding, no masses or nodularity palpable  Lungs: clear to auscultation bilaterally  Heart:  regular rate and rhythm, S1, S2 normal, no murmur, click, rub or gallop  Abdomen: soft, non-tender; bowel sounds normal; no masses,  no organomegaly.  Well healed Pfannenstiel incision   Vulva:  normal  Vagina: normal vagina, no discharge, exudate, lesion, or erythema  Cervix:  no cervical  motion tenderness and no lesions  Corpus: normal size, contour, position, consistency, mobility, non-tender  Adnexa:  normal adnexa and no mass, fullness, tenderness  Rectal Exam: Not performed.         Labs:  Lab Results  Component Value Date   HGB 7.8 (L) 05/08/2021     Assessment:   1. Postpartum care following cesarean delivery   2. Mastitis chronic, right   3. Lactating mother   4. Clogged duct, postpartum   5. Postpartum anemia     Plan:   1. Contraception: Nexplanon; will return in 7-10 days.  2. Will check Hgb for h/o postpartum anemia of less than 10.  3. Mastitis with clogged milk duct of right breast, possible abscess formation. Keflex prescribed, advised to discontinue Dicloxacillin. Can also consult lactation.  4. Follow up in: 1 week for final follow up of mastitis and Nexplanon insertion.   CRubie Maid MD [facility] 06/15/2021 10:01 AM

## 2021-06-15 NOTE — Patient Instructions (Signed)

## 2021-06-16 LAB — HEMOGLOBIN AND HEMATOCRIT, BLOOD
Hematocrit: 35 % (ref 34.0–46.6)
Hemoglobin: 10.9 g/dL — ABNORMAL LOW (ref 11.1–15.9)

## 2021-06-21 ENCOUNTER — Other Ambulatory Visit: Payer: Self-pay

## 2021-06-21 NOTE — Telephone Encounter (Signed)
Error

## 2021-06-23 ENCOUNTER — Other Ambulatory Visit: Payer: Self-pay

## 2021-06-23 ENCOUNTER — Encounter: Payer: Self-pay | Admitting: Surgery

## 2021-06-23 ENCOUNTER — Ambulatory Visit (INDEPENDENT_AMBULATORY_CARE_PROVIDER_SITE_OTHER): Payer: 59 | Admitting: Surgery

## 2021-06-23 VITALS — BP 122/86 | HR 89 | Temp 97.8°F | Ht 64.0 in | Wt 137.8 lb

## 2021-06-23 DIAGNOSIS — O9113 Abscess of breast associated with lactation: Secondary | ICD-10-CM | POA: Diagnosis not present

## 2021-06-23 DIAGNOSIS — N61 Mastitis without abscess: Secondary | ICD-10-CM

## 2021-06-23 MED ORDER — CLINDAMYCIN HCL 300 MG PO CAPS
600.0000 mg | ORAL_CAPSULE | Freq: Three times a day (TID) | ORAL | 0 refills | Status: DC
  Filled 2021-06-23: qty 30, 5d supply, fill #0

## 2021-06-23 NOTE — Progress Notes (Signed)
Patient ID: Renee Hart, female   DOB: Mar 12, 1991, 30 y.o.   MRN: [id number]  Chief Complaint: Right breast abscess  History of Present Illness Renee Hart is a 30 y.o. female with recent history of progressive postpartum mastitis, currently nursing her child with the opposite breast, continue to pump the right breast.  Has not had any evidence of purulent drainage or discharge.  She has had courses of antibiotics including dicloxacillin and more recently Keflex.  She felt the Keflex was not helpful.  This is been going on for over 2 weeks.  She reports that he is anterior superior right breast is hot to touch pink, painful with radiation to her axilla.  She also reports fever.  She has not had any ultrasound studies at this point in time.  Past Medical History Past Medical History:  Diagnosis Date   Medical history non-contributory       Past Surgical History:  Procedure Laterality Date   CESAREAN SECTION  05/07/2021   Procedure: CESAREAN SECTION;  Surgeon: Rubie Maid, MD;  Location: [facility] ORS;  Service: Obstetrics;;   KNEE ARTHROSCOPY WITH MEDIAL MENISECTOMY Right 08/28/2018   Procedure: KNEE ARTHROSCOPY WITH MEDIAL OR LATERAL MENISECTOMY;  Surgeon: Thornton Park, MD;  Location: [facility] ORS;  Service: Orthopedics;  Laterality: Right;   REFRACTIVE SURGERY      No Known Allergies  Current Outpatient Medications  Medication Sig Dispense Refill   clindamycin (CLEOCIN) 300 MG capsule Take 2 capsules (600 mg total) by mouth 3 (three) times daily. 30 capsule 0   ibuprofen (ADVIL) 600 MG tablet Take 1 tablet (600 mg total) by mouth every 6 (six) hours. 30 tablet 0   Prenatal Vit-Fe Fumarate-FA (MULTIVITAMIN-PRENATAL) 27-0.8 MG TABS tablet Take 1 tablet by mouth daily at 12 noon.     No current facility-administered medications for this visit.    Family History Family History  Problem Relation Age of Onset   Ovarian cancer Maternal Grandmother    Healthy Mother    Healthy  Father       Social History Social History   Tobacco Use   Smoking status: Never   Smokeless tobacco: Never  Vaping Use   Vaping Use: Never used  Substance Use Topics   Alcohol use: Yes    Comment: socially   Drug use: Never        Review of Systems  Constitutional:  Positive for fever. Negative for chills.  HENT: Negative.    Eyes: Negative.   Respiratory: Negative.    Cardiovascular: Negative.   Gastrointestinal: Negative.   Genitourinary: Negative.   Skin: Negative.   Neurological: Negative.   Psychiatric/Behavioral: Negative.       Physical Exam Blood pressure 122/86, pulse 89, temperature 97.8 F (36.6 C), temperature source Oral, height '5\' 4"'$  (1.626 m), weight 137 lb 12.8 oz (62.5 kg), SpO2 98 %, currently breastfeeding. Last Weight  Most recent update: 06/23/2021  2:12 PM    Weight  62.5 kg (137 lb 12.8 oz)             CONSTITUTIONAL: Well developed, and nourished, appropriately responsive and aware without distress.   EYES: Sclera non-icteric.   EARS, NOSE, MOUTH AND THROAT: Mask worn.   The oropharynx is clear. Oral mucosa is pink and moist.    Hearing is intact to voice.  NECK: Trachea is midline, and there is no jugular venous distension.  LYMPH NODES:  Lymph nodes in the neck are not enlarged. RESPIRATORY:  Lungs are clear,  and breath sounds are equal bilaterally. Normal respiratory effort without pathologic use of accessory muscles. CARDIOVASCULAR: Heart is regular in rate and rhythm. GI: The abdomen is soft, nontender, and nondistended.  GU: Centrally superiorly on the right breast there is an erythemic area with a centralized area of fluctuance, surrounded by indurated firmness.  Marked tenderness.  No dermal erosion.  No appreciable induration of the nipple areolar complex. MUSCULOSKELETAL:  Symmetrical muscle tone appreciated in all four extremities.    SKIN: Skin turgor is normal. No pathologic skin lesions appreciated.  NEUROLOGIC:  Motor and  sensation appear grossly normal.  Cranial nerves are grossly without defect. PSYCH:  Alert and oriented to person, place and time. Affect is appropriate for situation.  Data Reviewed I have personally reviewed what is currently available of the patient's imaging, recent labs and medical records.   Labs:  CBC Latest Ref Rng & Units 06/15/2021 05/08/2021 05/06/2021  WBC 4.0 - 10.5 K/uL - 14.5(H) 15.6(H)  Hemoglobin 11.1 - 15.9 g/dL 10.9(L) 7.8(L) 10.9(L)  Hematocrit 34.0 - 46.6 % 35.0 24.2(L) 33.9(L)  Platelets 150 - 400 K/uL - 208 238   CMP Latest Ref Rng & Units 03/17/2020 08/21/2018 03/26/2018  Glucose 65 - 99 mg/dL 101(H) 98 105(H)  BUN 6 - 20 mg/dL '8 10 11  '$ Creatinine 0.57 - 1.00 mg/dL 0.79 0.64 0.73  Sodium 134 - 144 mmol/L 136 138 141  Potassium 3.5 - 5.2 mmol/L 4.0 3.6 3.9  Chloride 96 - 106 mmol/L 99 103 102  CO2 20 - 29 mmol/L '24 28 24  '$ Calcium 8.7 - 10.2 mg/dL 9.6 9.4 9.6  Total Protein 6.0 - 8.5 g/dL 7.0 - 7.4  Total Bilirubin 0.0 - 1.2 mg/dL 0.4 - 0.5  Alkaline Phos 48 - 121 IU/L 70 - 54  AST 0 - 40 IU/L 21 - 14  ALT 0 - 32 IU/L 12 - 8      Imaging: Imaging ordered for tomorrow.  Including mammograms and breast ultrasound. Within last 24 hrs: No results found.  Assessment    Right breast abscess. Patient Active Problem List   Diagnosis Date Noted   Labor and delivery indication for care or intervention 05/06/2021   Full-term premature rupture of membranes (PROM) with unknown onset of labor 05/06/2021   Pregnancy 05/05/2021   Group B streptococcal carriage complicating pregnancy [id number]   Gastroesophageal reflux disease 03/30/2018   WEIGHT LOSS 03/10/2009   DIARRHEA 03/10/2009    Plan    Under ultrasound examination, we confirmed the presence of a discrete, well-defined abscess surrounded by indurated tissue as expected. With informed consent and was fully discussed with the patient, we proceeded with local prep with ChloraPrep, local infiltration with  lidocaine.  We then under ultrasound guidance utilizing 18-gauge needle and a 10 mL syringe retrieved a total of 25 mL of purulent material.  Imaging under ultrasound guidance showed near complete resolution of the abscess cavity.  Specimens were sent for culture and sensitivity.  I will initiate clindamycin orally, and lieu of a culture and sensitivity report.  We discussed that should fevers chills recurrence of pain discomfort prevail, will reevaluate admission for IV antibiotics.  I anticipate microbiology reports to be available early next week and will see her in the office considering reevaluation with ultrasound at that time.  Face-to-face time spent with the patient and accompanying care providers(if present) was 45 minutes, with more than 50% of the time spent counseling, educating, and coordinating care of the patient.  These notes generated with voice recognition software. I apologize for typographical errors.  Ronny Bacon M.D., FACS 06/23/2021, 2:59 PM

## 2021-06-23 NOTE — Patient Instructions (Addendum)
Keep a dressing over the area. Continue to pump that breast. Pick your medication up at the pharmacy. You can shower as usual. Please see your appointment listed below.     Mastitis Mastitis is irritation and swelling (inflammation) in an area of the breast. It is caused by an infection that occurs when germs (bacteria) enter the skin. This most often happens to breastfeeding mothers, but it can happen to other women too as well as some men. What are the causes? This condition is caused by: Germs. This can happen when germs enter the breast through cuts or openings in the skin. A plugged milk duct. This happens when something blocks the flow of milk in the breast. Nipple piercing. The pierced area can allow germs to enter the breast. Some types of breast cancer. What are the signs or symptoms? Swelling, redness, and pain in the breast. Swelling of the glands under the arm. Fluid flowing from the nipple. Fever. Chills. Vomiting or feeling like you may vomit. Fast heart rate. Feeling very tired. Headache. Muscle aches. A build-up of pus. This is also called an abscess. How is this treated? This condition may be treated with: Using heat or cold compresses. Taking medicine for pain. Taking antibiotic medicine. Rest. Drinking plenty of fluid. Removing fluid with a needle, if an abscess has developed. Follow these instructions at home: If you are breastfeeding:  Keep emptying your breasts by breastfeeding or by using a breast pump. Ask your doctor if you should make changes to your breast feeding or pumping routine. During breastfeeding, empty the first breast before going to the other breast. Use a breast pump if your baby is not emptying your breasts. Keep your nipples clean and dry. Massage your breasts during feeding or pumping as told by your doctor. If told, put moist heat on the affected area of your breast right before breastfeeding or pumping. Use the heat source that your  doctor tells you to use. If told, put ice on the affected area of your breast right after breastfeeding or pumping. To do this: Put ice in a plastic bag. Place a towel between your skin and the bag. Leave the ice on for 20 minutes. Take off the ice if your skin turns bright red. This is very important. If you cannot feel pain, heat, or cold, you have a greater risk of damage to the area. If you go back to work, pump your breasts while at work. Avoid letting your breasts get overly filled with milk (engorged). Medicines Take over-the-counter and prescription medicines only as told by your doctor. If you were prescribed an antibiotic medicine, take it as told by your doctor. Do not stop taking it even if you start to feel better. General instructions Do not wear a tight or underwire bra. Wear a soft support bra. Drink enough fluid to keep your pee (urine) pale yellow. This is important if you have a fever. Get plenty of rest. Keep all follow-up visits. Contact a doctor if: You have pus-like fluid leaking from your breast. You have a fever. Your symptoms do not get better within 2 days. Get help right away if: Your pain and swelling are getting worse. Your pain is not helped by medicine. You have a red line going from your breast toward your armpit. Summary Mastitis is irritation and swelling in an area of the breast. If you were prescribed an antibiotic medicine, do not stop taking it even if you start to feel better. Drink more fluids  and get plenty of rest. Contact a doctor if your symptoms do not get better within 2 days. This information is not intended to replace advice given to you by your health care provider. Make sure you discuss any questions you have with your health care provider. Document Revised: 08/02/2020 Document Reviewed: 08/02/2020 Elsevier Patient Education  2022 [employer].

## 2021-06-24 ENCOUNTER — Other Ambulatory Visit: Payer: 59

## 2021-06-24 ENCOUNTER — Inpatient Hospital Stay: Admission: RE | Admit: 2021-06-24 | Payer: 59 | Source: Ambulatory Visit

## 2021-06-28 ENCOUNTER — Ambulatory Visit (INDEPENDENT_AMBULATORY_CARE_PROVIDER_SITE_OTHER): Payer: 59 | Admitting: Obstetrics and Gynecology

## 2021-06-28 ENCOUNTER — Encounter: Payer: Self-pay | Admitting: Obstetrics and Gynecology

## 2021-06-28 ENCOUNTER — Encounter: Payer: Self-pay | Admitting: Surgery

## 2021-06-28 ENCOUNTER — Other Ambulatory Visit: Payer: Self-pay

## 2021-06-28 ENCOUNTER — Ambulatory Visit (INDEPENDENT_AMBULATORY_CARE_PROVIDER_SITE_OTHER): Payer: 59 | Admitting: Surgery

## 2021-06-28 VITALS — BP 103/76 | HR 80 | Resp 16 | Ht 64.0 in | Wt 137.1 lb

## 2021-06-28 VITALS — BP 116/78 | HR 73 | Temp 98.1°F | Ht 64.0 in | Wt 136.8 lb

## 2021-06-28 DIAGNOSIS — Z30017 Encounter for initial prescription of implantable subdermal contraceptive: Secondary | ICD-10-CM

## 2021-06-28 DIAGNOSIS — Z3202 Encounter for pregnancy test, result negative: Secondary | ICD-10-CM | POA: Diagnosis not present

## 2021-06-28 DIAGNOSIS — O9113 Abscess of breast associated with lactation: Secondary | ICD-10-CM

## 2021-06-28 MED ORDER — CLINDAMYCIN HCL 300 MG PO CAPS
600.0000 mg | ORAL_CAPSULE | Freq: Three times a day (TID) | ORAL | 0 refills | Status: DC
  Filled 2021-06-28: qty 60, 10d supply, fill #0

## 2021-06-28 NOTE — Progress Notes (Signed)
     GYNECOLOGY PROGRESS NOTE  Subjective:    Patient ID: Renee Hart, female    DOB: [birth year], 30 y.o.   MRN: [id number]  HPI  Patient is a 30 y.o. G56P1001 female who presents for 2 week follow up of postpartum mastitis (right breast).  She was changed from Dicloxacillin to Keflex at last visit, however despite use of antibiotics, abscess progressively worsened. She was seen by General Surgery last week and had drainage of the abscess (~ 25 cc per patient), and was started on Clindmaycin. Feels that the abscess is improving. Denies fevers, chills.   Patient also presents today for Nexplanon insertion for contraception.   The following portions of the patient's history were reviewed and updated as appropriate: allergies, current medications, past family history, past medical history, past social history, past surgical history, and problem list.  Review of Systems Pertinent items noted in HPI and remainder of comprehensive ROS otherwise negative.   Objective:   Blood pressure 103/76, pulse 80, resp. rate 16, height '5\' 4"'$  (1.626 m), weight 137 lb 1 oz (62.2 kg), last menstrual period 07/28/2020, currently breastfeeding. Body mass index is 23.53 kg/m. General appearance: alert and no distress Breasts: left breast appears normal. Right breast with decrease in erythema and induration, appears smaller than previously noted, ~ 3 x 3  cm of affected area.    Assessment:   1. Abscess of breast associated with lactation   2. Encounter for initial prescription of implantable subdermal contraceptive      Plan:   1. Abscess of breast associated with lactation - Continue course of Clindamycin as prescribed.  - Has f/u with General Surgery later this afternoon.  - Continue breastfeeding/pumping.   2. Encounter for initial prescription of implantable subdermal contraceptive - Nexplanon inserted today, see procedure note below.     Follow up in 4-6 months for annual  exam   GYNECOLOGY OFFICE PROCEDURE NOTE  Renee Hart is a 30 y.o. G1P1001 here for Nexplanon insertion.  Last pap smear was on 03/26/2018 and was normal.  No other gynecologic concerns.  Nexplanon Insertion Procedure Patient identified, informed consent performed, consent signed.   Patient does understand that irregular bleeding is a very common side effect of this medication. She was advised to have backup contraception for one week after placement. Pregnancy test in clinic today was negative.  Appropriate time out taken.  Patient's left arm was prepped and draped in the usual sterile fashion. The ruler used to measure and mark insertion area.  Patient was prepped with alcohol swab and then injected with 3 ml of 1% lidocaine.  She was prepped with betadine, Nexplanon removed from packaging,  Device confirmed in needle, then inserted full length of needle and withdrawn per handbook instructions. Nexplanon was able to palpated in the patient's arm; patient palpated the insert herself. There was minimal blood loss.  Patient insertion site covered with guaze and a pressure bandage to reduce any bruising.  The patient tolerated the procedure well and was given post procedure instructions.   Lot: [id number] Exp: 04/18/2023  Renee Maid, MD [facility]

## 2021-06-28 NOTE — Patient Instructions (Signed)
NEXPLANON PLACEMENT POST-PROCEDURE INSTRUCTIONS  You may take Ibuprofen, Aleve or Tylenol for pain if needed.  Pain should resolve within in 24 hours.  You may have intercourse after 24 hours.  If you using this for birth control, it is effective immediately.  You need to call if you have any fever, heavy bleeding, or redness at insertion site. Irregular bleeding is common the first several months after having a Nexplanonplaced. You do not need to call for this reason unless you are concerned.  Shower or bathe as normal.  You can remove the bandage after 24 hours.  

## 2021-06-28 NOTE — Patient Instructions (Signed)
Please pick up your prescription at your pharmacy.    If you have any concerns or questions, please feel free to call our office. See follow up appointment below.

## 2021-06-28 NOTE — Progress Notes (Signed)
Ms. Odem show significant improvement today.  Diminished erythema pinkness tenderness and pain present.  Denies fevers and chills.  She reports minimal serous drainage from the previous aspiration site.  She completed taking her prescribed clindamycin this morning and has no more.  On examination there appears to be significant diminishment in the area of induration and erythema.  The area of fluctuance within the perimeter of induration is also diminished however there appears to be some fluctuance still today. On ultrasound identified a small well-defined cavity of fluid.  With verbal informed consent proceeded with prepping the site and under ultrasound guidance an 18-gauge needle and sterile technique we aspirated another 3 mL of purulent drainage.  She tolerated this well.  This fluid was discarded, as we are currently awaiting culture and sensitivity from last week. Went ahead and refilled her clindamycin for another 10 days, completing a full 2-week course.  We will plan on follow-up in 2 weeks, otherwise I will reach out to her with any changes in antibiotic regimen.

## 2021-06-29 LAB — POCT URINE PREGNANCY: Preg Test, Ur: NEGATIVE

## 2021-06-30 ENCOUNTER — Telehealth: Payer: Self-pay

## 2021-06-30 NOTE — Telephone Encounter (Signed)
Spoke with Pam @ [facility]  to check status of culture results and was told they have the specimen-testing is being conducted and that Microbiology is running behind.

## 2021-07-07 ENCOUNTER — Other Ambulatory Visit: Payer: Self-pay

## 2021-07-07 ENCOUNTER — Encounter: Payer: Self-pay | Admitting: Surgery

## 2021-07-07 ENCOUNTER — Ambulatory Visit (INDEPENDENT_AMBULATORY_CARE_PROVIDER_SITE_OTHER): Payer: 59 | Admitting: Surgery

## 2021-07-07 VITALS — BP 115/83 | HR 98 | Temp 98.3°F | Ht 64.0 in | Wt 133.8 lb

## 2021-07-07 DIAGNOSIS — O9113 Abscess of breast associated with lactation: Secondary | ICD-10-CM

## 2021-07-07 NOTE — Progress Notes (Signed)
Bilan returns today in follow-up of her right breast abscess.  He has made excellent progress since her last aspiration.  She reports some spontaneous residual discharge through the skin, with complete resolution of the area, no residual pain, no residual induration.  She reports her breast milk continues to look normal and consistent with the opposite side. On examination there appears to be no residual fluctuance or cavity on exam.  There is no surrounding induration as she had had previously.  Levada Dy was present as chaperone. She will finish her course of clindamycin, and will follow up with [place] as needed should there be recurrence of symptoms.  She may resume breast-feeding from this breast as well.

## 2021-07-07 NOTE — Patient Instructions (Signed)
Finish your medication.  If you have any concerns or questions, please feel free to call our office. Follow up as needed.

## 2021-07-13 ENCOUNTER — Encounter: Payer: 59 | Admitting: Obstetrics and Gynecology

## 2021-07-29 ENCOUNTER — Telehealth: Payer: Self-pay | Admitting: Obstetrics and Gynecology

## 2021-07-29 NOTE — Telephone Encounter (Signed)
Received Fax requesting Physician Statement to return to work to be filled out and faxed back. Placed in Dr.Cherry's box for review on 07-29-2021 @ 9:15am.

## 2021-09-18 ENCOUNTER — Encounter: Payer: Self-pay | Admitting: Obstetrics and Gynecology

## 2021-10-04 ENCOUNTER — Other Ambulatory Visit: Payer: Self-pay

## 2021-10-04 MED ORDER — METOCLOPRAMIDE HCL 10 MG PO TABS
10.0000 mg | ORAL_TABLET | Freq: Three times a day (TID) | ORAL | 0 refills | Status: DC
  Filled 2021-10-04: qty 42, 14d supply, fill #0

## 2021-10-20 MED ORDER — METOCLOPRAMIDE HCL 10 MG PO TABS
10.0000 mg | ORAL_TABLET | Freq: Three times a day (TID) | ORAL | 2 refills | Status: DC
  Filled 2021-10-20: qty 180, 60d supply, fill #0

## 2021-10-20 NOTE — Addendum Note (Signed)
Addended by: Augusto Gamble on: 10/20/2021 08:09 PM   Modules accepted: Orders

## 2021-10-21 ENCOUNTER — Other Ambulatory Visit: Payer: Self-pay

## 2021-12-28 ENCOUNTER — Other Ambulatory Visit: Payer: Self-pay

## 2021-12-28 ENCOUNTER — Ambulatory Visit (INDEPENDENT_AMBULATORY_CARE_PROVIDER_SITE_OTHER): Payer: 59 | Admitting: Obstetrics and Gynecology

## 2021-12-28 ENCOUNTER — Other Ambulatory Visit (HOSPITAL_COMMUNITY)
Admission: RE | Admit: 2021-12-28 | Discharge: 2021-12-28 | Disposition: A | Discharge status: Home / Self Care | Payer: 59 | Source: Ambulatory Visit | Attending: Obstetrics and Gynecology | Admitting: Obstetrics and Gynecology

## 2021-12-28 ENCOUNTER — Encounter: Payer: Self-pay | Admitting: Obstetrics and Gynecology

## 2021-12-28 VITALS — BP 107/66 | HR 71 | Resp 16 | Ht 64.0 in | Wt 134.6 lb

## 2021-12-28 DIAGNOSIS — Z01419 Encounter for gynecological examination (general) (routine) without abnormal findings: Secondary | ICD-10-CM | POA: Diagnosis not present

## 2021-12-28 DIAGNOSIS — Z124 Encounter for screening for malignant neoplasm of cervix: Secondary | ICD-10-CM | POA: Insufficient documentation

## 2021-12-28 NOTE — Progress Notes (Signed)
? ? ?GYNECOLOGY ANNUAL PHYSICAL EXAM PROGRESS NOTE ? ?Subjective:  ? ? Renee Hart is a 31 y.o. G6P1001 female who presents for an annual exam. The patient has no complaints today. The patient is sexually active, married. The patient wears seatbelts: yes. The patient participates in regular exercise: yes. Has the patient ever been transfused or tattooed?: no ? ?Menstrual History: ?Menarche age: 58 or 11 ?No LMP recorded (lmp unknown).  Has occasional spotting with Nexplanon.  ? ? ? ?Gynecologic History:  ?Contraception: Nexplanon ?History of STI's: Denies ?Last Pap: 03/26/2018. Results were: normal.  Denies h/o abnormal pap smears. ? ? ? Upstream - 12/28/21 1059   ? ?  ? Pregnancy Intention Screening  ? Does the patient want to become pregnant in the next year? Unsure   ? Does the patient's partner want to become pregnant in the next year? Unsure   ? Would the patient like to discuss contraceptive options today? No   ?  ? Contraception Wrap Up  ? Current Method Hormonal Implant   ? End Method Hormonal Implant   ? Contraception Counseling Provided No   ? ?  ?  ? ?  ? ?The pregnancy intention screening data noted above was reviewed. Potential methods of contraception were discussed. The patient elected to proceed with Hormonal Implant. ? ? ?OB History  ?Gravida Para Term Preterm AB Living  ?'1 1 1 '$ 0 0 1  ?SAB IAB Ectopic Multiple Live Births  ?0 0 0 0 1  ?  ?# Outcome Date GA Lbr Len/2nd Weight Sex Delivery Anes PTL Lv  ?1 Term 05/07/21 [redacted]w[redacted]d/ 06:05 9 lb 4.5 oz (4.21 kg) M CS-LTranv EPI  LIV  ?   Name: WADELAI, ACHEYMADISON  ?   Apgar1: 7  Apgar5: 9  ? ? ?Past Medical History:  ?Diagnosis Date  ? Medical history non-contributory   ? ? ?Past Surgical History:  ?Procedure Laterality Date  ? CESAREAN SECTION  05/07/2021  ? Procedure: CESAREAN SECTION;  Surgeon: CRubie Maid MD;  Location: [facility] ORS;  Service: Obstetrics;;  ? KNEE ARTHROSCOPY WITH MEDIAL MENISECTOMY Right 08/28/2018  ? Procedure: KNEE ARTHROSCOPY  WITH MEDIAL OR LATERAL MENISECTOMY;  Surgeon: KThornton Park MD;  Location: [facility] ORS;  Service: Orthopedics;  Laterality: Right;  ? REFRACTIVE SURGERY    ? ? ?Family History  ?Problem Relation Age of Onset  ? Ovarian cancer Maternal Grandmother   ? Healthy Mother   ? Healthy Father   ? ? ?Social History  ? ?Socioeconomic History  ? Marital status: Married  ?  Spouse name: Not on file  ? Number of children: Not on file  ? Years of education: Not on file  ? Highest education level: Not on file  ?Occupational History  ? Not on file  ?Tobacco Use  ? Smoking status: Never  ? Smokeless tobacco: Never  ?Vaping Use  ? Vaping Use: Never used  ?Substance and Sexual Activity  ? Alcohol use: Yes  ?  Comment: socially  ? Drug use: Never  ? Sexual activity: Not Currently  ?  Birth control/protection: None  ?Other Topics Concern  ? Not on file  ?Social History Narrative  ? Not on file  ? ?Social Determinants of Health  ? ?Financial Resource Strain: Not on file  ?Food Insecurity: Not on file  ?Transportation Needs: Not on file  ?Physical Activity: Not on file  ?Stress: Not on file  ?Social Connections: Not on file  ?Intimate Partner Violence: Not on file  ? ? ?  Current Outpatient Medications on File Prior to Visit  ?Medication Sig Dispense Refill  ? clindamycin (CLEOCIN) 300 MG capsule Take 2 capsules (600 mg total) by mouth 3 (three) times daily. 60 capsule 0  ? ibuprofen (ADVIL) 600 MG tablet Take 1 tablet (600 mg total) by mouth every 6 (six) hours. 30 tablet 0  ? metoCLOPramide (REGLAN) 10 MG tablet Take 1 tablet (10 mg total) by mouth every 8 (eight) hours. 180 tablet 2  ? ?No current facility-administered medications on file prior to visit.  ? ? ?No Known Allergies ? ? ?Review of Systems ?Constitutional: negative for chills, fatigue, fevers and sweats ?Eyes: negative for irritation, redness and visual disturbance ?Ears, nose, mouth, throat, and face: negative for hearing loss, nasal congestion, snoring and  tinnitus ?Respiratory: negative for asthma, cough, sputum ?Cardiovascular: negative for chest pain, dyspnea, exertional chest pressure/discomfort, irregular heart beat, palpitations and syncope ?Gastrointestinal: negative for abdominal pain, change in bowel habits, nausea and vomiting ?Genitourinary: negative for abnormal menstrual periods, genital lesions, sexual problems and vaginal discharge, dysuria and urinary incontinence ?Integument/breast: negative for breast lump, breast tenderness and nipple discharge ?Hematologic/lymphatic: negative for bleeding and easy bruising ?Musculoskeletal:negative for back pain and muscle weakness ?Neurological: negative for dizziness, headaches, vertigo and weakness ?Endocrine: negative for diabetic symptoms including polydipsia, polyuria and skin dryness ?Allergic/Immunologic: negative for hay fever and urticaria    ? ? ?Objective:  ?Blood pressure 107/66, pulse 71, resp. rate 16, height '5\' 4"'$  (1.626 m), weight 134 lb 9.6 oz (61.1 kg), currently breastfeeding.  Body mass index is 23.1 kg/m?. ? ?  ?General Appearance:    Alert, cooperative, no distress, appears stated age  ?Head:    Normocephalic, without obvious abnormality, atraumatic  ?Eyes:    PERRL, conjunctiva/corneas clear, EOM's intact, both eyes  ?Ears:    Normal external ear canals, both ears  ?Nose:   Nares normal, septum midline, mucosa normal, no drainage or sinus tenderness  ?Throat:   Lips, mucosa, and tongue normal; teeth and gums normal  ?Neck:   Supple, symmetrical, trachea midline, no adenopathy; thyroid: no enlargement/tenderness/nodules; no carotid bruit or JVD  ?Back:     Symmetric, no curvature, ROM normal, no CVA tenderness  ?Lungs:     Clear to auscultation bilaterally, respirations unlabored  ?Chest Wall:    No tenderness or deformity  ? Heart:    Regular rate and rhythm, S1 and S2 normal, no murmur, rub or gallop  ?Breast Exam:    No tenderness, masses, or nipple abnormality  ?Abdomen:     Soft,  non-tender, bowel sounds active all four quadrants, no masses, no organomegaly.    ?Genitalia:    Pelvic:external genitalia normal, vagina without lesions, discharge, or tenderness, rectovaginal septum  normal. Cervix normal in appearance, no cervical motion tenderness, no adnexal masses or tenderness.  Uterus normal size, shape, mobile, regular contours, nontender.  ?Rectal:    Normal external sphincter.  No hemorrhoids appreciated. Internal exam not done.   ?Extremities:   Extremities normal, atraumatic, no cyanosis or edema  ?Pulses:   2+ and symmetric all extremities  ?Skin:   Skin color, texture, turgor normal, no rashes or lesions  ?Lymph nodes:   Cervical, supraclavicular, and axillary nodes normal  ?Neurologic:   CNII-XII intact, normal strength, sensation and reflexes throughout  ? ?. ? ?Labs:  ?Lab Results  ?Component Value Date  ? WBC 14.5 (H) 05/08/2021  ? HGB 10.9 (L) 06/15/2021  ? HCT 35.0 06/15/2021  ? MCV 90.6 05/08/2021  ?  PLT 208 05/08/2021  ? ? ?Lab Results  ?Component Value Date  ? CREATININE 0.79 03/17/2020  ? BUN 8 03/17/2020  ? NA 136 03/17/2020  ? K 4.0 03/17/2020  ? CL 99 03/17/2020  ? CO2 24 03/17/2020  ? ? ?Lab Results  ?Component Value Date  ? ALT 12 03/17/2020  ? AST 21 03/17/2020  ? ALKPHOS 70 03/17/2020  ? BILITOT 0.4 03/17/2020  ? ? ?Lab Results  ?Component Value Date  ? TSH 3.550 03/17/2020  ? ? ? ?Assessment:  ? ?1. Encounter for well woman exam with routine gynecological exam   ?2. Cervical cancer screening   ? ?  ?Plan:  ?Blood tests: CBC with diff and Comprehensive metabolic panel. ?Breast self exam technique reviewed and patient encouraged to perform self-exam monthly. ?Contraception: Nexplanon. ?Discussed healthy lifestyle modifications. ?Pap smear ordered. ?COVID vaccination status: up to date, has received 1st booster.  ?Follow up in 1 year for annual exam ? ? ?Rubie Maid, MD ?[facility] ? ?

## 2021-12-28 NOTE — Patient Instructions (Addendum)
Preventive Care 61-32 Years Old, Female ?Preventive care refers to lifestyle choices and visits with your health care provider that can promote health and wellness. Preventive care visits are also called wellness exams. ?What can I expect for my preventive care visit? ?Counseling ?During your preventive care visit, your health care provider may ask about your: ?Medical history, including: ?Past medical problems. ?Family medical history. ?Pregnancy history. ?Current health, including: ?Menstrual cycle. ?Method of birth control. ?Emotional well-being. ?Home life and relationship well-being. ?Sexual activity and sexual health. ?Lifestyle, including: ?Alcohol, nicotine or tobacco, and drug use. ?Access to firearms. ?Diet, exercise, and sleep habits. ?Work and work Statistician. ?Sunscreen use. ?Safety issues such as seatbelt and bike helmet use. ?Physical exam ?Your health care provider may check your: ?Height and weight. These may be used to calculate your BMI (body mass index). BMI is a measurement that tells if you are at a healthy weight. ?Waist circumference. This measures the distance around your waistline. This measurement also tells if you are at a healthy weight and may help predict your risk of certain diseases, such as type 2 diabetes and high blood pressure. ?Heart rate and blood pressure. ?Body temperature. ?Skin for abnormal spots. ?What immunizations do I need? ?Vaccines are usually given at various ages, according to a schedule. Your health care provider will recommend vaccines for you based on your age, medical history, and lifestyle or other factors, such as travel or where you work. ?What tests do I need? ?Screening ?Your health care provider may recommend screening tests for certain conditions. This may include: ?Pelvic exam and Pap test. ?Lipid and cholesterol levels. ?Diabetes screening. This is done by checking your blood sugar (glucose) after you have not eaten for a while (fasting). ?Hepatitis B  test. ?Hepatitis C test. ?HIV (human immunodeficiency virus) test. ?STI (sexually transmitted infection) testing, if you are at risk. ?BRCA-related cancer screening. This may be done if you have a family history of breast, ovarian, tubal, or peritoneal cancers. ?Talk with your health care provider about your test results, treatment options, and if necessary, the need for more tests. ?Follow these instructions at home: ?Eating and drinking ? ?Eat a healthy diet that includes fresh fruits and vegetables, whole grains, lean protein, and low-fat dairy products. ?Take vitamin and mineral supplements as recommended by your health care provider. ?Do not drink alcohol if: ?Your health care provider tells you not to drink. ?You are pregnant, may be pregnant, or are planning to become pregnant. ?If you drink alcohol: ?Limit how much you have to 0-1 drink a day. ?Know how much alcohol is in your drink. In the [place] one drink equals one 12 oz bottle of beer (355 mL), one 5 oz glass of wine (148 mL), or one 1? oz glass of hard liquor (44 mL). ?Lifestyle ?Brush your teeth every morning and night with fluoride toothpaste. Floss one time each day. ?Exercise for at least 30 minutes 5 or more days each week. ?Do not use any products that contain nicotine or tobacco. These products include cigarettes, chewing tobacco, and vaping devices, such as e-cigarettes. If you need help quitting, ask your health care provider. ?Do not use drugs. ?If you are sexually active, practice safe sex. Use a condom or other form of protection to prevent STIs. ?If you do not wish to become pregnant, use a form of birth control. If you plan to become pregnant, see your health care provider for a prepregnancy visit. ?Find healthy ways to manage stress, such as: ?Meditation, yoga,  or listening to music. ?Journaling. ?Talking to a trusted person. ?Spending time with friends and family. ?Minimize exposure to UV radiation to reduce your risk of skin  cancer. ?Safety ?Always wear your seat belt while driving or riding in a vehicle. ?Do not drive: ?If you have been drinking alcohol. Do not ride with someone who has been drinking. ?If you have been using any mind-altering substances or drugs. ?While texting. ?When you are tired or distracted. ?Wear a helmet and other protective equipment during sports activities. ?If you have firearms in your house, make sure you follow all gun safety procedures. ?Seek help if you have been physically or sexually abused. ?What's next? ?Go to your health care provider once a year for an annual wellness visit. ?Ask your health care provider how often you should have your eyes and teeth checked. ?Stay up to date on all vaccines. ?This information is not intended to replace advice given to you by your health care provider. Make sure you discuss any questions you have with your health care provider. ?Document Revised: 03/30/2021 Document Reviewed: 03/30/2021 ?Elsevier Patient Education ? [place]. ? ?

## 2021-12-29 LAB — COMPREHENSIVE METABOLIC PANEL
ALT: 10 IU/L (ref 0–32)
AST: 17 IU/L (ref 0–40)
Albumin/Globulin Ratio: 2.4 — ABNORMAL HIGH (ref 1.2–2.2)
Albumin: 5 g/dL — ABNORMAL HIGH (ref 3.8–4.8)
Alkaline Phosphatase: 88 IU/L (ref 44–121)
BUN/Creatinine Ratio: 11 (ref 9–23)
BUN: 10 mg/dL (ref 6–20)
Bilirubin Total: 0.6 mg/dL (ref 0.0–1.2)
CO2: 24 mmol/L (ref 20–29)
Calcium: 9.2 mg/dL (ref 8.7–10.2)
Chloride: 101 mmol/L (ref 96–106)
Creatinine, Ser: 0.87 mg/dL (ref 0.57–1.00)
Globulin, Total: 2.1 g/dL (ref 1.5–4.5)
Glucose: 93 mg/dL (ref 70–99)
Potassium: 4.4 mmol/L (ref 3.5–5.2)
Sodium: 139 mmol/L (ref 134–144)
Total Protein: 7.1 g/dL (ref 6.0–8.5)
eGFR: 91 mL/min/{1.73_m2} (ref >59)

## 2021-12-29 LAB — CBC
Hematocrit: 42.2 % (ref 34.0–46.6)
Hemoglobin: 13.9 g/dL (ref 11.1–15.9)
MCH: 29.9 pg (ref 26.6–33.0)
MCHC: 32.9 g/dL (ref 31.5–35.7)
MCV: 91 fL (ref 79–97)
Platelets: 250 10*3/uL (ref 150–450)
RBC: 4.65 x10E6/uL (ref 3.77–5.28)
RDW: 13.1 % (ref 11.7–15.4)
WBC: 4.9 10*3/uL (ref 3.4–10.8)

## 2022-01-02 LAB — CYTOLOGY - PAP
Comment: NEGATIVE
Diagnosis: NEGATIVE
High risk HPV: NEGATIVE

## 2022-01-18 IMAGING — US US OB COMP +14 WK
1 of 2 series · 13 of 28 positions shown · non-contrast
Comparison: none

CLINICAL DATA: Pregnancy.  Assess fetal anatomy

EXAM:
OBSTETRICAL ULTRASOUND >14 WKS

[Series 1: us ob comp + 14 wk · 13 of 102 slices shown]
[im 4/102]
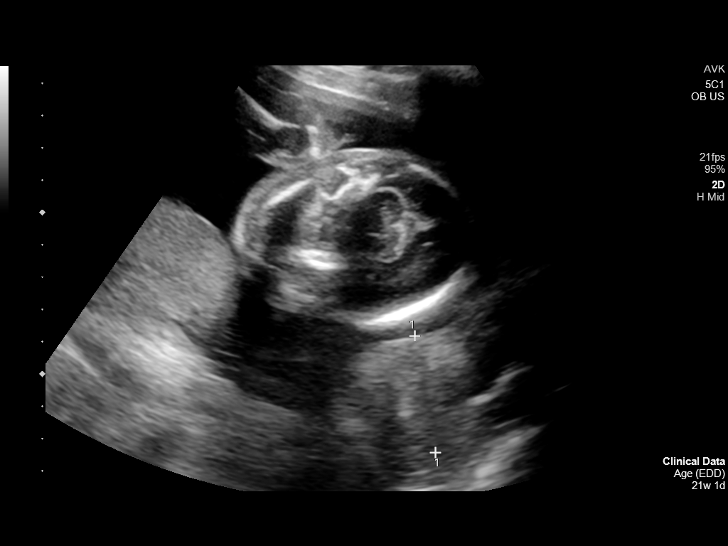
[im 12/102]
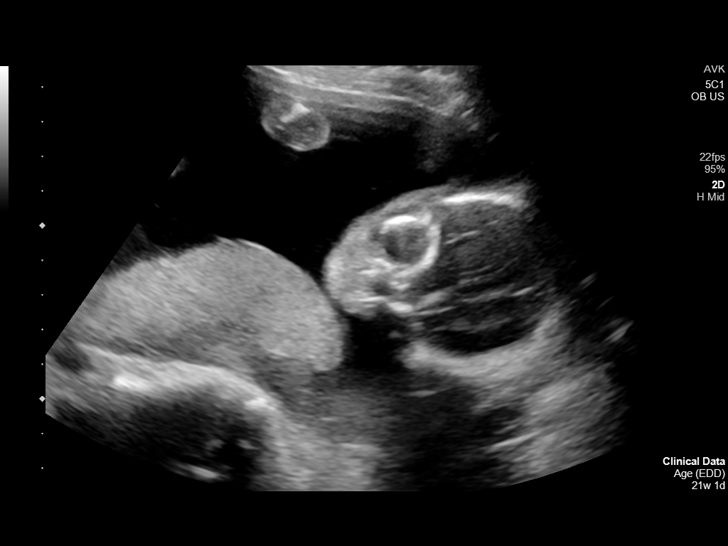
[im 20/102]
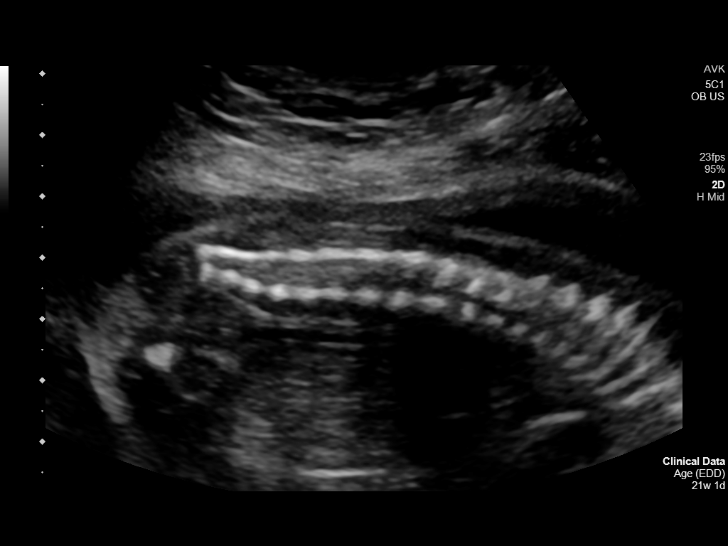
[im 28/102]
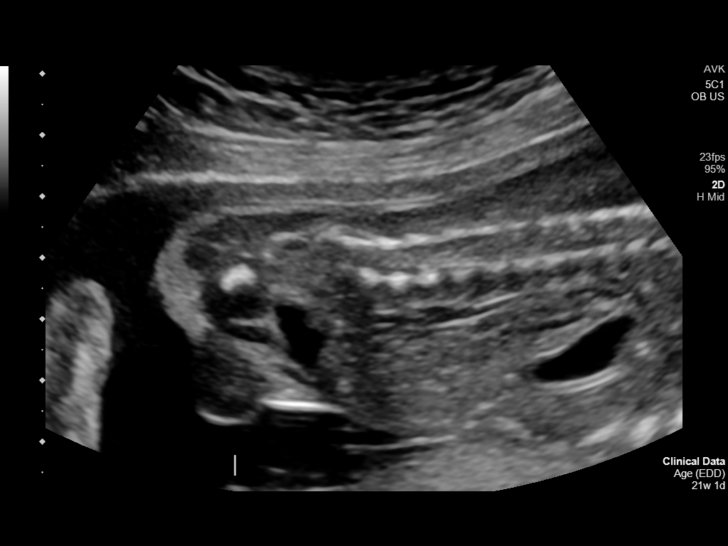
[im 35/102]
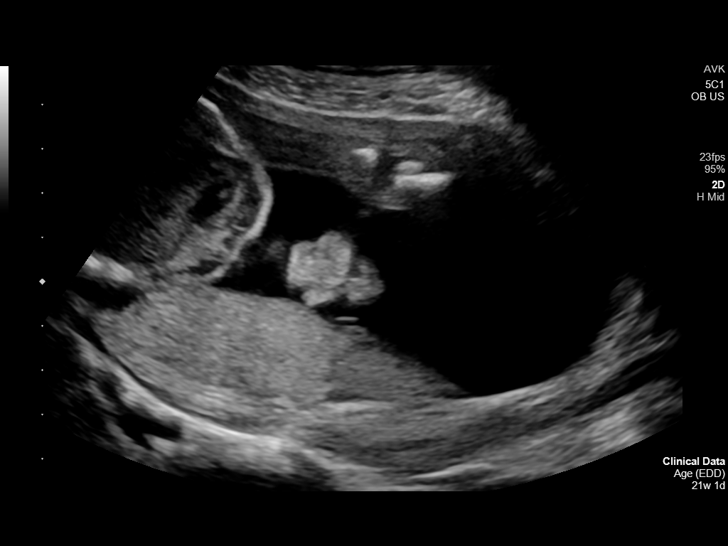
[im 43/102]
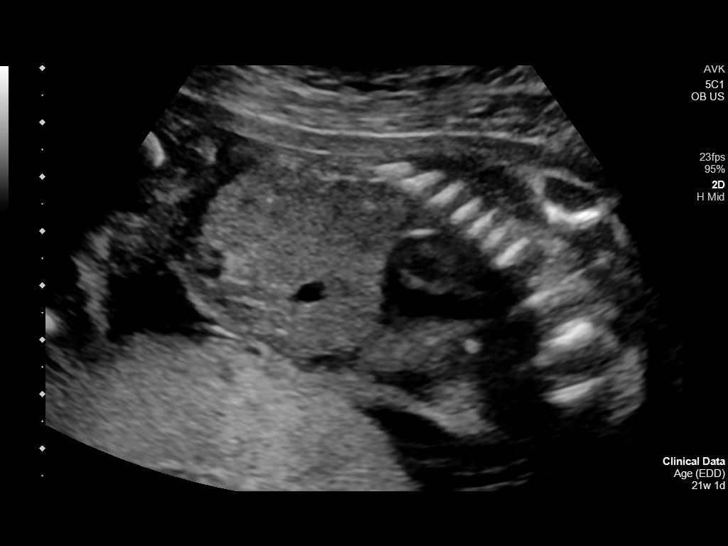
[im 55/102]
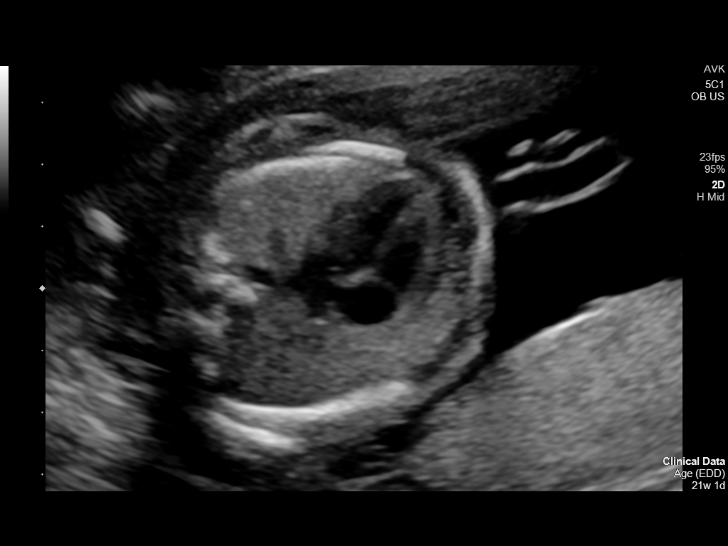
[im 63/102]
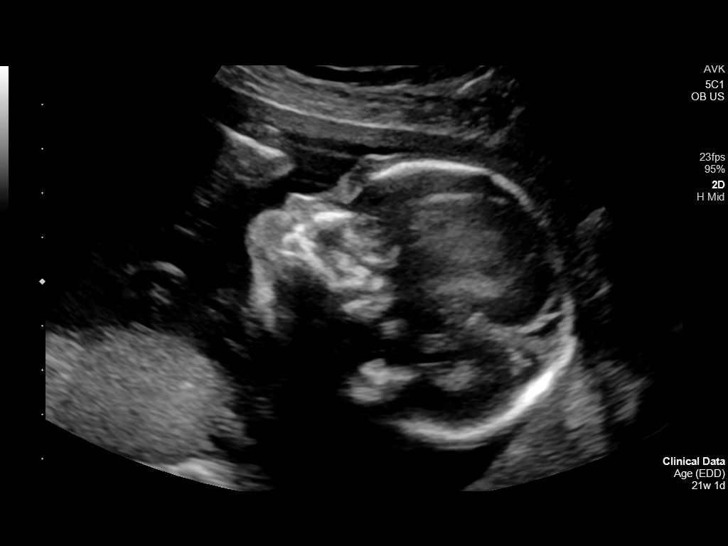
[im 70/102]
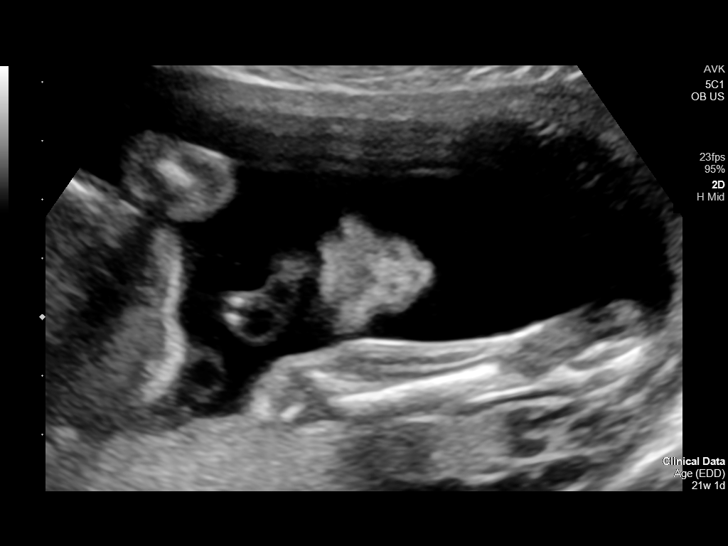
[im 78/102]
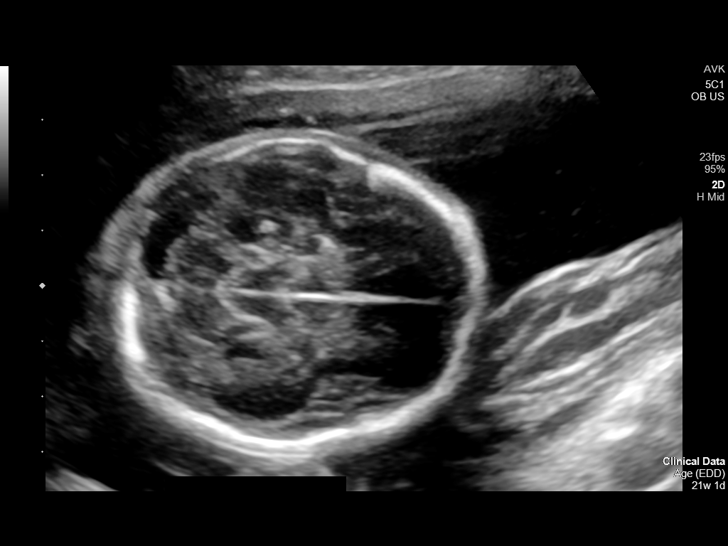
[im 86/102]
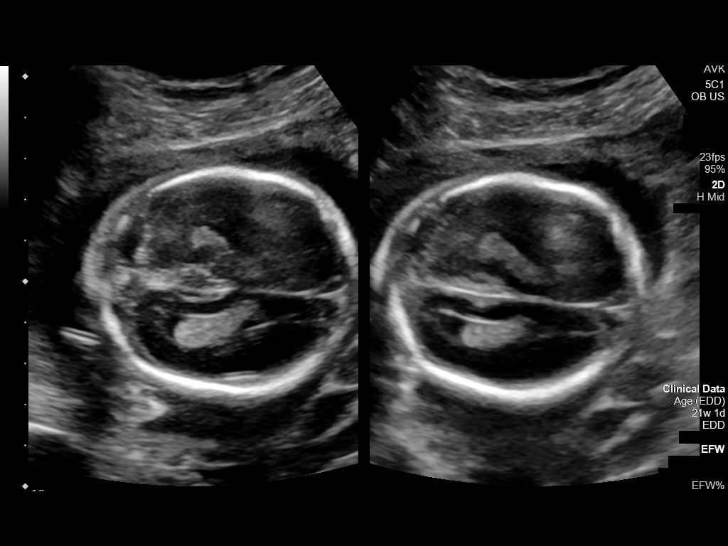
[im 94/102]
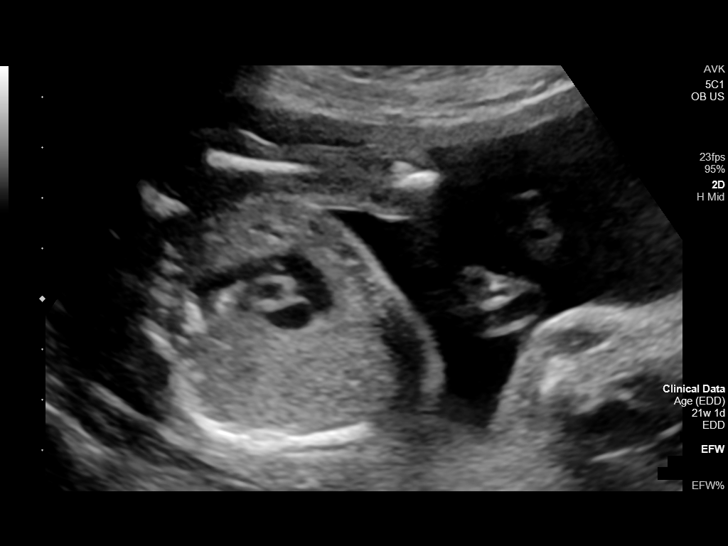
[im 102/102]
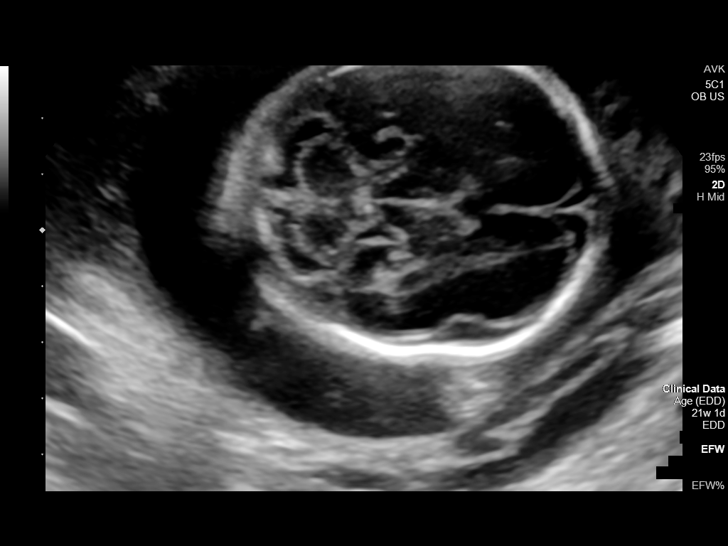

[13 of 28 positions shown; findings below may reference images not displayed]

FINDINGS: Number of Fetuses: 1

Heart Rate:  157 bpm

Movement: Yes

Presentation: Cephalic

Previa: No

Placental Location: Posterior

Amniotic Fluid (Subjective): Normal

Amniotic Fluid (Objective):

Vertical pocket = 4.1cm

FETAL BIOMETRY

BPD: 5.5cm 22w 4d

HC:   19.9cm 22w 0d

AC:   15.9cm 21w 0d

FL:   3.6cm 21w 3d

Current Mean GA: 21w 4d US EDC: 05/12/2021

Assigned GA:  21w 1d Assigned EDC: 05/15/2021

Estimated Fetal Weight:  419g 57%ile

FETAL ANATOMY

Lateral Ventricles: Appears normal

Thalami/CSP: Appears normal

Posterior Fossa:  Appears normal

Nuchal Region: Appears normal   NFT= N/A > 20 WKS

Upper Lip: Appears normal

Spine: Appears normal

4 Chamber Heart on Left: Appears normal

LVOT: Appears normal

RVOT: Appears normal

Stomach on Left: Appears normal

3 Vessel Cord: Appears normal

Cord Insertion site: Appears normal

Kidneys: Appears normal

Bladder: Appears normal

Extremities: Appears normal

Sex: Male

Technically difficult due to: None

Maternal Findings:

Cervix:  Closed.  3.6 cm.
IMPRESSION: 1. Single live intrauterine gestation in cephalic presentation.
Assigned gestational age of 21 weeks 1 day. Adequate interval
growth.
2. Estimated fetal weight is in the 57th percentile.
3. Complete fetal anatomic survey, as above. No fetal abnormalities
identified.

## 2022-03-14 ENCOUNTER — Telehealth: Payer: 59 | Admitting: Family Medicine

## 2022-03-14 ENCOUNTER — Other Ambulatory Visit: Payer: Self-pay

## 2022-03-14 DIAGNOSIS — B9689 Other specified bacterial agents as the cause of diseases classified elsewhere: Secondary | ICD-10-CM | POA: Diagnosis not present

## 2022-03-14 DIAGNOSIS — J019 Acute sinusitis, unspecified: Secondary | ICD-10-CM

## 2022-03-14 MED ORDER — AMOXICILLIN-POT CLAVULANATE 875-125 MG PO TABS
1.0000 | ORAL_TABLET | Freq: Two times a day (BID) | ORAL | 0 refills | Status: AC
Start: 2022-03-14 — End: 2022-03-21
  Filled 2022-03-14: qty 14, 7d supply, fill #0

## 2022-03-14 NOTE — Progress Notes (Signed)

## 2022-05-11 ENCOUNTER — Encounter: Payer: Self-pay | Admitting: Obstetrics and Gynecology

## 2022-06-28 ENCOUNTER — Ambulatory Visit: Payer: 59 | Admitting: Obstetrics and Gynecology

## 2022-06-28 ENCOUNTER — Encounter: Payer: Self-pay | Admitting: Obstetrics and Gynecology

## 2022-06-28 VITALS — BP 120/85 | HR 75 | Resp 16 | Ht 64.0 in | Wt 143.2 lb

## 2022-06-28 DIAGNOSIS — Z975 Presence of (intrauterine) contraceptive device: Secondary | ICD-10-CM | POA: Diagnosis not present

## 2022-06-28 DIAGNOSIS — Z3046 Encounter for surveillance of implantable subdermal contraceptive: Secondary | ICD-10-CM

## 2022-06-28 DIAGNOSIS — N921 Excessive and frequent menstruation with irregular cycle: Secondary | ICD-10-CM

## 2022-06-28 NOTE — Progress Notes (Signed)
    GYNECOLOGY PROGRESS NOTE  Subjective:    Patient ID: Renee Hart, female    DOB: [birth year], 31 y.o.   MRN: [id number]  HPI  Patient is a 31 y.o. G51P1001 female who presents for irregular menstrual cycles on Nexplanon. Device was inserted 06/15/2022.  She reports that bleeding stopped recently, but she had bleed for four months daily. Bleeding was heavy, but no pain. She noticed some dull pain in her right breast, but it has resolved as well. Also thinks she has been having a small amount of weight gain. She thinks that the Nexplanon may have caused her to have these symptoms, and wants to consider having it removed today.  Is also considering conception within the next year.   The following portions of the patient's history were reviewed and updated as appropriate: allergies, current medications, past family history, past medical history, past social history, past surgical history, and problem list.  Review of Systems Pertinent items noted in HPI and remainder of comprehensive ROS otherwise negative.   Objective:   Blood pressure 120/85, pulse 75, resp. rate 16, height '5\' 4"'$  (1.626 m), weight 143 lb 3.2 oz (65 kg), currently breastfeeding.  Body mass index is 24.58 kg/m. General appearance: alert and no distress Extremities: left arm with Nexplanon in place, non-tender.   Assessment:   1. Breakthrough bleeding on Nexplanon   2. Nexplanon removal      Plan:   Counseled on options of use of NSAIDs, supplemental estrogen for breakthrough bleeding vs removal. Patient desires removal. Nexplanon removal performed today (see procedure note below).     Nexplanon Removal Patient identified, informed consent performed, consent signed.   Appropriate time out taken. Nexplanon site identified.  Area prepped in usual sterile fashon. One ml of 1% lidocaine was used to anesthetize the area at the distal end of the implant. A small stab incision was made right beside the implant on the  distal portion.  The Nexplanon rod was grasped using hemostats and removed without difficulty.  There was minimal blood loss. There were no complications.  3 ml of 1% lidocaine was injected around the incision for post-procedure analgesia.  Steri-strips were applied over the small incision.  A pressure bandage was applied to reduce any bruising.  The patient tolerated the procedure well and was given post procedure instructions.  Patient is planning to use condoms for contraception, and later may attempt conception.    Rubie Maid, MD [facility]

## 2022-06-30 ENCOUNTER — Encounter: Payer: Self-pay | Admitting: Obstetrics and Gynecology

## 2022-09-11 ENCOUNTER — Encounter: Payer: Self-pay | Admitting: Internal Medicine

## 2022-11-14 ENCOUNTER — Encounter: Payer: Self-pay | Admitting: General Practice

## 2022-11-24 ENCOUNTER — Ambulatory Visit (INDEPENDENT_AMBULATORY_CARE_PROVIDER_SITE_OTHER): Payer: 59 | Admitting: Internal Medicine

## 2022-11-24 ENCOUNTER — Encounter: Payer: Self-pay | Admitting: Internal Medicine

## 2022-11-24 VITALS — BP 98/72 | HR 94 | Ht 64.0 in | Wt 138.0 lb

## 2022-11-24 DIAGNOSIS — M23261 Derangement of other lateral meniscus due to old tear or injury, right knee: Secondary | ICD-10-CM

## 2022-11-24 DIAGNOSIS — F419 Anxiety disorder, unspecified: Secondary | ICD-10-CM

## 2022-11-24 NOTE — Assessment & Plan Note (Addendum)
No pain currently S/p repair but with new injury Doing well without limitation to daily activities Will pursue Ortho care if needed

## 2022-11-24 NOTE — Assessment & Plan Note (Signed)
Mild symptoms post partum but doing very well currently Would not recommend any medications at this time Continue regular exercise and healthy diet

## 2022-11-24 NOTE — Progress Notes (Signed)
Date:  11/24/2022   Name:  Renee Hart   DOB:  05/22/1991   MRN:  [id number]   Chief Complaint: Establish Care New patient here to establish care.  Anxiety Presents for initial visit. Onset was 6 to 12 months ago. The problem has been gradually improving. Symptoms include irritability and nervous/anxious behavior. Patient reports no chest pain, dizziness or shortness of breath. Symptoms occur rarely.   Treatments tried: Exercise, adequate sleep and healthy diet. The treatment provided significant relief. Compliance with prior treatments has been good.    Lab Results  Component Value Date   NA 139 12/28/2021   K 4.4 12/28/2021   CO2 24 12/28/2021   GLUCOSE 93 12/28/2021   BUN 10 12/28/2021   CREATININE 0.87 12/28/2021   CALCIUM 9.2 12/28/2021   EGFR 91 12/28/2021   GFRNONAA 101 03/17/2020   No results found for: "CHOL", "HDL", "LDLCALC", "LDLDIRECT", "TRIG", "CHOLHDL" Lab Results  Component Value Date   TSH 3.550 03/17/2020   No results found for: "HGBA1C" Lab Results  Component Value Date   WBC 4.9 12/28/2021   HGB 13.9 12/28/2021   HCT 42.2 12/28/2021   MCV 91 12/28/2021   PLT 250 12/28/2021   Lab Results  Component Value Date   ALT 10 12/28/2021   AST 17 12/28/2021   ALKPHOS 88 12/28/2021   BILITOT 0.6 12/28/2021   No results found for: "25OHVITD2", "25OHVITD3", "VD25OH"   Review of Systems  Constitutional:  Positive for irritability. Negative for chills, fatigue and fever.  HENT:  Negative for trouble swallowing.   Respiratory:  Negative for cough, chest tightness and shortness of breath.   Cardiovascular:  Negative for chest pain and leg swelling.  Gastrointestinal:  Negative for abdominal pain, constipation and diarrhea.  Genitourinary:  Negative for menstrual problem.  Musculoskeletal:  Positive for arthralgias (right knee torn meniscus).  Neurological:  Negative for dizziness and headaches.  Psychiatric/Behavioral:  Negative for dysphoric mood  and sleep disturbance. The patient is nervous/anxious.     Patient Active Problem List   Diagnosis Date Noted   Old bucket handle tear of lateral meniscus of right knee 11/24/2022   Anxiety disorder 11/24/2022    No Known Allergies  Past Surgical History:  Procedure Laterality Date   CESAREAN SECTION  05/07/2021   Procedure: CESAREAN SECTION;  Surgeon: Rubie Maid, MD;  Location: [facility] ORS;  Service: Obstetrics;;   EYE SURGERY     [place]   KNEE ARTHROSCOPY WITH MEDIAL MENISECTOMY Right 08/28/2018   Procedure: KNEE ARTHROSCOPY WITH MEDIAL OR LATERAL MENISECTOMY;  Surgeon: Thornton Park, MD;  Location: [facility] ORS;  Service: Orthopedics;  Laterality: Right;   REFRACTIVE SURGERY      Social History   Tobacco Use   Smoking status: Never   Smokeless tobacco: Never  Vaping Use   Vaping Use: Never used  Substance Use Topics   Alcohol use: Yes    Alcohol/week: 2.0 standard drinks of alcohol    Types: 2 Glasses of wine per week    Comment: socially   Drug use: Never     Medication list has been reviewed and updated.  No outpatient medications have been marked as taking for the 11/24/22 encounter (Office Visit) with Glean Hess, MD.       11/24/2022    2:21 PM  GAD 7 : Generalized Anxiety Score  Nervous, Anxious, on Edge 1  Control/stop worrying 0  Worry too much - different things 1  Trouble relaxing 0  Restless 0  Easily annoyed or irritable 1  Afraid - awful might happen 0  Total GAD 7 Score 3  Anxiety Difficulty Not difficult at all       11/24/2022    2:21 PM 12/28/2021   10:59 AM 06/28/2021    3:01 PM  Depression screen PHQ 2/9  Decreased Interest 0 0 0  Down, Depressed, Hopeless 0 0 0  PHQ - 2 Score 0 0 0  Altered sleeping 1    Tired, decreased energy 1    Change in appetite 0    Feeling bad or failure about yourself  0    Trouble concentrating 0    Moving slowly or fidgety/restless 0    Suicidal thoughts 0    PHQ-9 Score 2    Difficult doing  work/chores Somewhat difficult      BP Readings from Last 3 Encounters:  11/24/22 98/72  06/28/22 120/85  12/28/21 107/66    Physical Exam Vitals and nursing note reviewed.  Constitutional:      General: She is not in acute distress.    Appearance: Normal appearance. She is well-developed.  HENT:     Head: Normocephalic and atraumatic.  Neck:     Vascular: No carotid bruit.  Cardiovascular:     Rate and Rhythm: Normal rate and regular rhythm.  Pulmonary:     Effort: Pulmonary effort is normal. No respiratory distress.     Breath sounds: No wheezing or rhonchi.  Musculoskeletal:     Cervical back: Normal range of motion.     Right lower leg: No edema.     Left lower leg: No edema.  Lymphadenopathy:     Cervical: No cervical adenopathy.  Skin:    General: Skin is warm and dry.     Findings: No rash.  Neurological:     General: No focal deficit present.     Mental Status: She is alert and oriented to person, place, and time.  Psychiatric:        Mood and Affect: Mood normal.        Behavior: Behavior normal.     Wt Readings from Last 3 Encounters:  11/24/22 138 lb (62.6 kg)  06/28/22 143 lb 3.2 oz (65 kg)  12/28/21 134 lb 9.6 oz (61.1 kg)    BP 98/72   Pulse 94   Ht 5' 4"$  (1.626 m)   Wt 138 lb (62.6 kg)   SpO2 98%   BMI 23.69 kg/m   Assessment and Plan: Problem List Items Addressed This Visit       Musculoskeletal and Integument   Old bucket handle tear of lateral meniscus of right knee - Primary    No pain currently S/p repair but with new injury Doing well without limitation to daily activities Will pursue Ortho care if needed        Other   Anxiety disorder    Mild symptoms post partum but doing very well currently Would not recommend any medications at this time Continue regular exercise and healthy diet        Partially dictated using Editor, commissioning. Any errors are unintentional.  Halina Maidens, MD [facility]  11/24/2022

## 2023-04-22 ENCOUNTER — Encounter: Payer: Self-pay | Admitting: Obstetrics and Gynecology

## 2023-04-23 ENCOUNTER — Ambulatory Visit: Payer: Commercial Managed Care - PPO

## 2023-05-01 ENCOUNTER — Ambulatory Visit (INDEPENDENT_AMBULATORY_CARE_PROVIDER_SITE_OTHER): Payer: Commercial Managed Care - PPO

## 2023-05-01 VITALS — BP 129/86 | HR 87 | Ht 64.0 in | Wt 128.5 lb

## 2023-05-01 DIAGNOSIS — Z3201 Encounter for pregnancy test, result positive: Secondary | ICD-10-CM | POA: Diagnosis not present

## 2023-05-01 DIAGNOSIS — Z3009 Encounter for other general counseling and advice on contraception: Secondary | ICD-10-CM | POA: Insufficient documentation

## 2023-05-01 DIAGNOSIS — N912 Amenorrhea, unspecified: Secondary | ICD-10-CM | POA: Insufficient documentation

## 2023-05-01 DIAGNOSIS — Z3200 Encounter for pregnancy test, result unknown: Secondary | ICD-10-CM | POA: Insufficient documentation

## 2023-05-01 LAB — POCT URINE PREGNANCY: Preg Test, Ur: POSITIVE — AB

## 2023-05-01 NOTE — Patient Instructions (Signed)
First Trimester of Pregnancy  The first trimester of pregnancy starts on the first day of your last menstrual period until the end of week 12. This is months 1 through 3 of pregnancy. A week after a sperm fertilizes an egg, the egg will implant into the wall of the uterus and begin to develop into a baby. By the end of 12 weeks, all the baby's organs will be formed and the baby will be 2-3 inches in size. Body changes during your first trimester Your body goes through many changes during pregnancy. The changes vary and generally return to normal after your baby is born. Physical changes You may gain or lose weight. Your breasts may begin to grow larger and become tender. The tissue that surrounds your nipples (areola) may become darker. Dark spots or blotches (chloasma or mask of pregnancy) may develop on your face. You may have changes in your hair. These can include thickening or thinning of your hair or changes in texture. Health changes You may feel nauseous, and you may vomit. You may have heartburn. You may develop headaches. You may develop constipation. Your gums may bleed and may be sensitive to brushing and flossing. Other changes You may tire easily. You may urinate more often. Your menstrual periods will stop. You may have a loss of appetite. You may develop cravings for certain kinds of food. You may have changes in your emotions from day to day. You may have more vivid and strange dreams. Follow these instructions at home: Medicines Follow your health care provider's instructions regarding medicine use. Specific medicines may be either safe or unsafe to take during pregnancy. Do not take any medicines unless told to by your health care provider. Take a prenatal vitamin that contains at least 600 micrograms (mcg) of folic acid. Eating and drinking Eat a healthy diet that includes fresh fruits and vegetables, whole grains, good sources of protein such as meat, eggs, or tofu,  and low-fat dairy products. Avoid raw meat and unpasteurized juice, milk, and cheese. These carry germs that can harm you and your baby. If you feel nauseous or you vomit: Eat 4 or 5 small meals a day instead of 3 large meals. Try eating a few soda crackers. Drink liquids between meals instead of during meals. You may need to take these actions to prevent or treat constipation: Drink enough fluid to keep your urine pale yellow. Eat foods that are high in fiber, such as beans, whole grains, and fresh fruits and vegetables. Limit foods that are high in fat and processed sugars, such as fried or sweet foods. Activity Exercise only as directed by your health care provider. Most people can continue their usual exercise routine during pregnancy. Try to exercise for 30 minutes at least 5 days a week. Stop exercising if you develop pain or cramping in the lower abdomen or lower back. Avoid exercising if it is very hot or humid or if you are at high altitude. Avoid heavy lifting. If you choose to, you may have sex unless your health care provider tells you not to. Relieving pain and discomfort Wear a good support bra to relieve breast tenderness. Rest with your legs elevated if you have leg cramps or low back pain. If you develop bulging veins (varicose veins) in your legs: Wear support hose as told by your health care provider. Elevate your feet for 15 minutes, 3-4 times a day. Limit salt in your diet. Safety Wear your seat belt at all times when   driving or riding in a car. Talk with your health care provider if someone is verbally or physically abusive to you. Talk with your health care provider if you are feeling sad or have thoughts of hurting yourself. Lifestyle Do not use hot tubs, steam rooms, or saunas. Do not douche. Do not use tampons or scented sanitary pads. Do not use herbal remedies, alcohol, illegal drugs, or medicines that are not approved by your health care provider. Chemicals  in these products can harm your baby. Do not use any products that contain nicotine or tobacco, such as cigarettes, e-cigarettes, and chewing tobacco. If you need help quitting, ask your health care provider. Avoid cat litter boxes and soil used by cats. These carry germs that can cause birth defects in the baby and possibly loss of the unborn baby (fetus) by miscarriage or stillbirth. General instructions During routine prenatal visits in the first trimester, your health care provider will do a physical exam, perform necessary tests, and ask you how things are going. Keep all follow-up visits. This is important. Ask for help if you have counseling or nutritional needs during pregnancy. Your health care provider can offer advice or refer you to specialists for help with various needs. Schedule a dentist appointment. At home, brush your teeth with a soft toothbrush. Floss gently. Write down your questions. Take them to your prenatal visits. Where to find more information American Pregnancy Association: americanpregnancy.org [employer]: https://www.todd-brady.net/ Office on [employer] Health: MightyReward.[employer].nz Contact a health care provider if you have: Dizziness. A fever. Mild pelvic cramps, pelvic pressure, or nagging pain in the abdominal area. Nausea, vomiting, or diarrhea that lasts for 24 hours or longer. A bad-smelling vaginal discharge. Pain when you urinate. Known exposure to a contagious illness, such as chickenpox, measles, Zika virus, HIV, or hepatitis. Get help right away if you have: Spotting or bleeding from your vagina. Severe abdominal cramping or pain. Shortness of breath or chest pain. Any kind of trauma, such as from a fall or a car crash. New or increased pain, swelling, or redness in an arm or leg. Summary The first trimester of pregnancy starts on the first day of your last menstrual period until the end of week  12 (months 1 through 3). Eating 4 or 5 small meals a day rather than 3 large meals may help to relieve nausea and vomiting. Do not use any products that contain nicotine or tobacco, such as cigarettes, e-cigarettes, and chewing tobacco. If you need help quitting, ask your health care provider. Keep all follow-up visits. This is important. This information is not intended to replace advice given to you by your health care provider. Make sure you discuss any questions you have with your health care provider. Document Revised: 03/10/2020 Document Reviewed: 01/15/2020 Elsevier Patient Education  2024 Elsevier Inc.  Commonly Asked Questions During Pregnancy  Cats: A parasite can be excreted in cat feces.  To avoid exposure you need to have another person empty the little box.  If you must empty the litter box you will need to wear gloves.  Wash your hands after handling your cat.  This parasite can also be found in raw or undercooked meat so this should also be avoided.  Colds, Sore Throats, Flu: Please check your medication sheet to see what you can take for symptoms.  If your symptoms are unrelieved by these medications please call the office.  Dental Work: Most any dental work Agricultural consultant recommends is permitted.  X-rays should only be taken during the first trimester if absolutely necessary.  Your abdomen should be shielded with a lead apron during all x-rays.  Please notify your provider prior to receiving any x-rays.  Novocaine is fine; gas is not recommended.  If your dentist requires a note from [place] prior to dental work please call the office and we will provide one for you.  Exercise: Exercise is an important part of staying healthy during your pregnancy.  You may continue most exercises you were accustomed to prior to pregnancy.  Later in your pregnancy you will most likely notice you have difficulty with activities requiring balance like riding a bicycle.  It is important that you listen to  your body and avoid activities that put you at a higher risk of falling.  Adequate rest and staying well hydrated are a must!  If you have questions about the safety of specific activities ask your provider.    Exposure to Children with illness: Try to avoid obvious exposure; report any symptoms to [place] when noted,  If you have chicken pos, red measles or mumps, you should be immune to these diseases.   Please do not take any vaccines while pregnant unless you have checked with your OB provider.  Fetal Movement: After 28 weeks we recommend you do "kick counts" twice daily.  Lie or sit down in a calm quiet environment and count your baby movements "kicks".  You should feel your baby at least 10 times per hour.  If you have not felt 10 kicks within the first hour get up, walk around and have something sweet to eat or drink then repeat for an additional hour.  If count remains less than 10 per hour notify your provider.  Fumigating: Follow your pest control agent's advice as to how long to stay out of your home.  Ventilate the area well before re-entering.  Hemorrhoids:   Most over-the-counter preparations can be used during pregnancy.  Check your medication to see what is safe to use.  It is important to use a stool softener or fiber in your diet and to drink lots of liquids.  If hemorrhoids seem to be getting worse please call the office.   Hot Tubs:  Hot tubs Jacuzzis and saunas are not recommended while pregnant.  These increase your internal body temperature and should be avoided.  Intercourse:  Sexual intercourse is safe during pregnancy as long as you are comfortable, unless otherwise advised by your provider.  Spotting may occur after intercourse; report any bright red bleeding that is heavier than spotting.  Labor:  If you know that you are in labor, please go to the hospital.  If you are unsure, please call the office and let us help you decide what to do.  Lifting, straining, etc:  If your job  requires heavy lifting or straining please check with your provider for any limitations.  Generally, you should not lift items heavier than that you can lift simply with your hands and arms (no back muscles)  Painting:  Paint fumes do not harm your pregnancy, but may make you ill and should be avoided if possible.  Latex or water based paints have less odor than oils.  Use adequate ventilation while painting.  Permanents & Hair Color:  Chemicals in hair dyes are not recommended as they cause increase hair dryness which can increase hair loss during pregnancy.  " Highlighting" and permanents are allowed.  Dye may be absorbed differently and permanents  may not hold as well during pregnancy.  Sunbathing:  Use a sunscreen, as skin burns easily during pregnancy.  Drink plenty of fluids; avoid over heating.  Tanning Beds:  Because their possible side effects are still unknown, tanning beds are not recommended.  Ultrasound Scans:  Routine ultrasounds are performed at approximately 20 weeks.  You will be able to see your baby's general anatomy an if you would like to know the gender this can usually be determined as well.  If it is questionable when you conceived you may also receive an ultrasound early in your pregnancy for dating purposes.  Otherwise ultrasound exams are not routinely performed unless there is a medical necessity.  Although you can request a scan we ask that you pay for it when conducted because insurance does not cover " patient request" scans.  Work: If your pregnancy proceeds without complications you may work until your due date, unless your physician or employer advises otherwise.  Round Ligament Pain/Pelvic Discomfort:  Sharp, shooting pains not associated with bleeding are fairly common, usually occurring in the second trimester of pregnancy.  They tend to be worse when standing up or when you remain standing for long periods of time.  These are the result of pressure of certain  pelvic ligaments called "round ligaments".  Rest, Tylenol and heat seem to be the most effective relief.  As the womb and fetus grow, they rise out of the pelvis and the discomfort improves.  Please notify the office if your pain seems different than that described.  It may represent a more serious condition.  Common Medications Safe in Pregnancy  Acne:      Constipation:  Benzoyl Peroxide     Colace  Clindamycin      Dulcolax Suppository  Topica Erythromycin     Fibercon  Salicylic Acid      Metamucil         Miralax AVOID:        Senakot   Accutane    Cough:  Retin-A       Cough Drops  Tetracycline      Phenergan w/ Codeine if Rx  Minocycline      Robitussin (Plain & DM)  Antibiotics:     Crabs/Lice:  Ceclor       RID  Cephalosporins    AVOID:  E-Mycins      Kwell  Keflex  Macrobid/Macrodantin   Diarrhea:  Penicillin      Kao-Pectate  Zithromax      Imodium AD         PUSH FLUIDS AVOID:       Cipro     Fever:  Tetracycline      Tylenol (Regular or Extra  Minocycline       Strength)  Levaquin      Extra Strength-Do not          Exceed 8 tabs/24 hrs Caffeine:        200mg /day (equiv. To 1 cup of coffee or  approx. 3 12 oz sodas)         Gas: Cold/Hayfever:       Gas-X  Benadryl      Mylicon  Claritin       Phazyme  **Claritin-D        Chlor-Trimeton    Headaches:  Dimetapp      ASA-Free Excedrin  Drixoral-Non-Drowsy     Cold Compress  Mucinex (Guaifenasin)     Tylenol (Regular or Extra  Sudafed/Sudafed-12 Hour  Strength)  **Sudafed PE Pseudoephedrine   Tylenol Cold & Sinus     Vicks Vapor Rub  Zyrtec  **AVOID if Problems With Blood Pressure         Heartburn: Avoid lying down for at least 1 hour after meals  Aciphex      Maalox     Rash:  Milk of Magnesia     Benadryl    Mylanta       1% Hydrocortisone Cream  Pepcid  Pepcid Complete   Sleep Aids:  Prevacid      Ambien   Prilosec       Benadryl  Rolaids       Chamomile Tea  Tums (Limit  4/day)     Unisom         Tylenol PM         Warm milk-add vanilla or  Hemorrhoids:       Sugar for taste  Anusol/Anusol H.C.  (RX: Analapram 2.5%)  Sugar Substitutes:  Hydrocortisone OTC     Ok in moderation  Preparation H      Tucks        Vaseline lotion applied to tissue with wiping    Herpes:     Throat:  Acyclovir      Oragel  Famvir  Valtrex     Vaccines:         Flu Shot Leg Cramps:       *Gardasil  Benadryl      Hepatitis A         Hepatitis B Nasal Spray:       Pneumovax  Saline Nasal Spray     Polio Booster         Tetanus Nausea:       Tuberculosis test or PPD  Vitamin B6 25 mg TID   AVOID:    Dramamine      *Gardasil  Emetrol       Live Poliovirus  Ginger Root 250 mg QID    MMR (measles, mumps &  High Complex Carbs @ Bedtime    rebella)  Sea Bands-Accupressure    Varicella (Chickenpox)  Unisom 1/2 tab TID     *No known complications           If received before Pain:         Known pregnancy;   Darvocet       Resume series after  Lortab        Delivery  Percocet    Yeast:   Tramadol      Femstat  Tylenol 3      Gyne-lotrimin  Ultram       Monistat  Vicodin           MISC:         All Sunscreens           Hair Coloring/highlights          Insect Repellant's          (Including DEET)         Mystic Tans  

## 2023-05-01 NOTE — Progress Notes (Signed)
    NURSE VISIT NOTE  Subjective:    Patient ID: Renee Hart, female    DOB: 1991/02/01, 32 y.o.   MRN: [id number]  HPI  Patient is a 32 y.o. G46P1001 female who presents for evaluation of amenorrhea. She believes she could be pregnant. Pregnancy is desired. Sexual Activity: single partner, contraception: none. Current symptoms also include: positive home pregnancy test and mild cramping . Last period was normal.    Objective:    BP 129/86   Pulse 87   Ht 5\' 4"  (1.626 m)   Wt 128 lb 8 oz (58.3 kg)   LMP 03/23/2023 (Exact Date)   BMI 22.06 kg/m   Lab Review  Results for orders placed or performed in visit on 05/01/23  POCT urine pregnancy  Result Value Ref Range   Preg Test, Ur Positive (A) Negative    Assessment:   1. Amenorrhea   2. Pregnancy examination or test, pregnancy unconfirmed     Plan:   Pregnancy Test: Positive  Estimated Date of Delivery: 12/28/23 Encouraged well-balanced diet, plenty of rest when needed, pre-natal vitamins daily and walking for exercise.  Discussed self-help for nausea, avoiding OTC medications until consulting provider or pharmacist, other than Tylenol as needed, minimal caffeine (1-2 cups daily) and avoiding alcohol.   She will schedule her nurse visit @ 7-[redacted] wks pregnant, u/s for dating @10  wk, and NOB visit at [redacted] wk pregnant.    Feel free to call with any questions.     Rocco Serene, LPN

## 2023-05-07 ENCOUNTER — Ambulatory Visit: Payer: Commercial Managed Care - PPO | Admitting: Dermatology

## 2023-05-17 ENCOUNTER — Ambulatory Visit (INDEPENDENT_AMBULATORY_CARE_PROVIDER_SITE_OTHER): Payer: Self-pay

## 2023-05-17 VITALS — Wt 130.0 lb

## 2023-05-17 DIAGNOSIS — Z3689 Encounter for other specified antenatal screening: Secondary | ICD-10-CM

## 2023-05-17 DIAGNOSIS — Z369 Encounter for antenatal screening, unspecified: Secondary | ICD-10-CM

## 2023-05-17 DIAGNOSIS — Z3480 Encounter for supervision of other normal pregnancy, unspecified trimester: Secondary | ICD-10-CM | POA: Insufficient documentation

## 2023-05-17 NOTE — Progress Notes (Signed)
New OB Intake  I connected with  Renee Hart on 05/17/23 at  3:15 PM EDT by telephone  and verified that I am speaking with the correct person using two identifiers. Nurse is located at [facility] and pt is located at work.  I explained I am completing New OB Intake today. We discussed her EDD of 12/28/2023 that is based on LMP of 03/23/23. Pt is G2/P1001. I reviewed her allergies, medications, Medical/Surgical/OB history, and appropriate screenings. There are cats in the home.  Hsb changes the litter box.  Based on history, this is a/an pregnancy uncomplicated .   Patient Active Problem List   Diagnosis Date Noted   Supervision of other normal pregnancy, antepartum 05/17/2023   Amenorrhea 05/01/2023   Pregnancy examination or test, pregnancy unconfirmed 05/01/2023   Old bucket handle tear of lateral meniscus of right knee 11/24/2022   Anxiety disorder 11/24/2022    Concerns addressed today None  Delivery Plans:  Plans to deliver at [facility].  Anatomy US Explained first scheduled [place] will be Aug 16th and an anatomy scan will be done at 20 weeks.   Labs Discussed genetic screening with patient. Patient desires genetic testing to be drawn at new OB visit. Discussed possible labs to be drawn at new OB appointment.  COVID Vaccine Patient has had COVID vaccine.   Social Determinants of Health Food Insecurity: denies food insecurity Transportation: Patient denies transportation needs. Childcare: Discussed no children allowed at ultrasound appointments.   First visit review I reviewed new OB appt with pt. I explained she will have ob bloodwork and pap smear/pelvic exam if indicated. Explained pt will be seen by an AOB provider at first visit; encounter routed to appropriate provider.   Loran Senters, [facility] 05/17/2023  3:43 PM

## 2023-05-17 NOTE — Patient Instructions (Signed)
First Trimester of Pregnancy  The first trimester of pregnancy starts on the first day of your last menstrual period until the end of week 12. This is also called months 1 through 3 of pregnancy. Body changes during your first trimester Your body goes through many changes during pregnancy. The changes usually return to normal after your baby is born. Physical changes You may gain or lose weight. Your breasts may grow larger and hurt. The area around your nipples may get darker. Dark spots or blotches may develop on your face. You may have changes in your hair. Health changes You may feel like you might vomit (nauseous), and you may vomit. You may have heartburn. You may have headaches. You may have trouble pooping (constipation). Your gums may bleed. Other changes You may get tired easily. You may pee (urinate) more often. Your menstrual periods will stop. You may not feel hungry. You may want to eat certain kinds of food. You may have changes in your emotions from day to day. You may have more dreams. Follow these instructions at home: Medicines Take over-the-counter and prescription medicines only as told by your doctor. Some medicines are not safe during pregnancy. Take a prenatal vitamin that contains at least 600 micrograms (mcg) of folic acid. Eating and drinking Eat healthy meals that include: Fresh fruits and vegetables. Whole grains. Good sources of protein, such as meat, eggs, or tofu. Low-fat dairy products. Avoid raw meat and unpasteurized juice, milk, and cheese. If you feel like you may vomit, or you vomit: Eat 4 or 5 small meals a day instead of 3 large meals. Try eating a few soda crackers. Drink liquids between meals instead of during meals. You may need to take these actions to prevent or treat trouble pooping: Drink enough fluids to keep your pee (urine) pale yellow. Eat foods that are high in fiber. These include beans, whole grains, and fresh fruits and  vegetables. Limit foods that are high in fat and sugar. These include fried or sweet foods. Activity Exercise only as told by your doctor. Most people can do their usual exercise routine during pregnancy. Stop exercising if you have cramps or pain in your lower belly (abdomen) or low back. Do not exercise if it is too hot or too humid, or if you are in a place of great height (high altitude). Avoid heavy lifting. If you choose to, you may have sex unless your doctor tells you not to. Relieving pain and discomfort Wear a good support bra if your breasts are sore. Rest with your legs raised (elevated) if you have leg cramps or low back pain. If you have bulging veins (varicose veins) in your legs: Wear support hose as told by your doctor. Raise your feet for 15 minutes, 3-4 times a day. Limit salt in your food. Safety Wear your seat belt at all times when you are in a car. Talk with your doctor if someone is hurting you or yelling at you. Talk with your doctor if you are feeling sad or have thoughts of hurting yourself. Lifestyle Do not use hot tubs, steam rooms, or saunas. Do not douche. Do not use tampons or scented sanitary pads. Do not use herbal medicines, illegal drugs, or medicines that are not approved by your doctor. Do not drink alcohol. Do not smoke or use any products that contain nicotine or tobacco. If you need help quitting, ask your doctor. Avoid cat litter boxes and soil that is used by cats. These carry   germs that can cause harm to the baby and can cause a loss of your baby by miscarriage or stillbirth. General instructions Keep all follow-up visits. This is important. Ask for help if you need counseling or if you need help with nutrition. Your doctor can give you advice or tell you where to go for help. Visit your dentist. At home, brush your teeth with a soft toothbrush. Floss gently. Write down your questions. Take them to your prenatal visits. Where to find more  information American Pregnancy Association: americanpregnancy.org American College of Obstetricians and Gynecologists: www.acog.org Office on Women's Health: womenshealth.gov/pregnancy Contact a doctor if: You are dizzy. You have a fever. You have mild cramps or pressure in your lower belly. You have a nagging pain in your belly area. You continue to feel like you may vomit, you vomit, or you have watery poop (diarrhea) for 24 hours or longer. You have a bad-smelling fluid coming from your vagina. You have pain when you pee. You are exposed to a disease that spreads from person to person, such as chickenpox, measles, Zika virus, HIV, or hepatitis. Get help right away if: You have spotting or bleeding from your vagina. You have very bad belly cramping or pain. You have shortness of breath or chest pain. You have any kind of injury, such as from a fall or a car crash. You have new or increased pain, swelling, or redness in an arm or leg. Summary The first trimester of pregnancy starts on the first day of your last menstrual period until the end of week 12 (months 1 through 3). Eat 4 or 5 small meals a day instead of 3 large meals. Do not smoke or use any products that contain nicotine or tobacco. If you need help quitting, ask your doctor. Keep all follow-up visits. This information is not intended to replace advice given to you by your health care provider. Make sure you discuss any questions you have with your health care provider. Document Revised: 03/10/2020 Document Reviewed: 01/15/2020 Elsevier Patient Education  2024 [employer] Inc. Commonly Asked Questions During Pregnancy  Cats: A parasite can be excreted in cat feces.  To avoid exposure you need to have another person empty the little box.  If you must empty the litter box you will need to wear gloves.  Wash your hands after handling your cat.  This parasite can also be found in raw or undercooked meat so this should also be  avoided.  Colds, Sore Throats, Flu: Please check your medication sheet to see what you can take for symptoms.  If your symptoms are unrelieved by these medications please call the office.  Dental Work: Most any dental work your dentist recommends is permitted.  X-rays should only be taken during the first trimester if absolutely necessary.  Your abdomen should be shielded with a lead apron during all x-rays.  Please notify your provider prior to receiving any x-rays.  Novocaine is fine; gas is not recommended.  If your dentist requires a note from us prior to dental work please call the office and we will provide one for you.  Exercise: Exercise is an important part of staying healthy during your pregnancy.  You may continue most exercises you were accustomed to prior to pregnancy.  Later in your pregnancy you will most likely notice you have difficulty with activities requiring balance like riding a bicycle.  It is important that you listen to your body and avoid activities that put you at a higher   risk of falling.  Adequate rest and staying well hydrated are a must!  If you have questions about the safety of specific activities ask your provider.    Exposure to Children with illness: Try to avoid obvious exposure; report any symptoms to us when noted,  If you have chicken pos, red measles or mumps, you should be immune to these diseases.   Please do not take any vaccines while pregnant unless you have checked with your OB provider.  Fetal Movement: After 28 weeks we recommend you do "kick counts" twice daily.  Lie or sit down in a calm quiet environment and count your baby movements "kicks".  You should feel your baby at least 10 times per hour.  If you have not felt 10 kicks within the first hour get up, walk around and have something sweet to eat or drink then repeat for an additional hour.  If count remains less than 10 per hour notify your provider.  Fumigating: Follow your pest control agent's  advice as to how long to stay out of your home.  Ventilate the area well before re-entering.  Hemorrhoids:   Most over-the-counter preparations can be used during pregnancy.  Check your medication to see what is safe to use.  It is important to use a stool softener or fiber in your diet and to drink lots of liquids.  If hemorrhoids seem to be getting worse please call the office.   Hot Tubs:  Hot tubs Jacuzzis and saunas are not recommended while pregnant.  These increase your internal body temperature and should be avoided.  Intercourse:  Sexual intercourse is safe during pregnancy as long as you are comfortable, unless otherwise advised by your provider.  Spotting may occur after intercourse; report any bright red bleeding that is heavier than spotting.  Labor:  If you know that you are in labor, please go to the hospital.  If you are unsure, please call the office and let us help you decide what to do.  Lifting, straining, etc:  If your job requires heavy lifting or straining please check with your provider for any limitations.  Generally, you should not lift items heavier than that you can lift simply with your hands and arms (no back muscles)  Painting:  Paint fumes do not harm your pregnancy, but may make you ill and should be avoided if possible.  Latex or water based paints have less odor than oils.  Use adequate ventilation while painting.  Permanents & Hair Color:  Chemicals in hair dyes are not recommended as they cause increase hair dryness which can increase hair loss during pregnancy.  " Highlighting" and permanents are allowed.  Dye may be absorbed differently and permanents may not hold as well during pregnancy.  Sunbathing:  Use a sunscreen, as skin burns easily during pregnancy.  Drink plenty of fluids; avoid over heating.  Tanning Beds:  Because their possible side effects are still unknown, tanning beds are not recommended.  Ultrasound Scans:  Routine ultrasounds are performed  at approximately 20 weeks.  You will be able to see your baby's general anatomy an if you would like to know the gender this can usually be determined as well.  If it is questionable when you conceived you may also receive an ultrasound early in your pregnancy for dating purposes.  Otherwise ultrasound exams are not routinely performed unless there is a medical necessity.  Although you can request a scan we ask that you pay for it when   conducted because insurance does not cover " patient request" scans.  Work: If your pregnancy proceeds without complications you may work until your due date, unless your physician or employer advises otherwise.  Round Ligament Pain/Pelvic Discomfort:  Sharp, shooting pains not associated with bleeding are fairly common, usually occurring in the second trimester of pregnancy.  They tend to be worse when standing up or when you remain standing for long periods of time.  These are the result of pressure of certain pelvic ligaments called "round ligaments".  Rest, Tylenol and heat seem to be the most effective relief.  As the womb and fetus grow, they rise out of the pelvis and the discomfort improves.  Please notify the office if your pain seems different than that described.  It may represent a more serious condition.  Common Medications Safe in Pregnancy  Acne:      Constipation:  Benzoyl Peroxide     Colace  Clindamycin      Dulcolax Suppository  Topica Erythromycin     Fibercon  Salicylic Acid      Metamucil         Miralax AVOID:        Senakot   Accutane    Cough:  Retin-A       Cough Drops  Tetracycline      Phenergan w/ Codeine if Rx  Minocycline      Robitussin (Plain & DM)  Antibiotics:     Crabs/Lice:  Ceclor       RID  Cephalosporins    AVOID:  E-Mycins      Kwell  Keflex  Macrobid/Macrodantin   Diarrhea:  Penicillin      Kao-Pectate  Zithromax      Imodium AD         PUSH FLUIDS AVOID:       Cipro     Fever:  Tetracycline      Tylenol (Regular  or Extra  Minocycline       Strength)  Levaquin      Extra Strength-Do not          Exceed 8 tabs/24 hrs Caffeine:        <200mg/day (equiv. To 1 cup of coffee or  approx. 3 12 oz sodas)         Gas: Cold/Hayfever:       Gas-X  Benadryl      Mylicon  Claritin       Phazyme  **Claritin-D        Chlor-Trimeton    Headaches:  Dimetapp      ASA-Free Excedrin  Drixoral-Non-Drowsy     Cold Compress  Mucinex (Guaifenasin)     Tylenol (Regular or Extra  Sudafed/Sudafed-12 Hour     Strength)  **Sudafed PE Pseudoephedrine   Tylenol Cold & Sinus     Vicks Vapor Rub  Zyrtec  **AVOID if Problems With Blood Pressure         Heartburn: Avoid lying down for at least 1 hour after meals  Aciphex      Maalox     Rash:  Milk of Magnesia     Benadryl    Mylanta       1% Hydrocortisone Cream  Pepcid  Pepcid Complete   Sleep Aids:  Prevacid      Ambien   Prilosec       Benadryl  Rolaids       Chamomile Tea  Tums (Limit 4/day)     Unisom           Tylenol PM         Warm milk-add vanilla or  Hemorrhoids:       Sugar for taste  Anusol/Anusol H.C.  (RX: Analapram 2.5%)  Sugar Substitutes:  Hydrocortisone OTC     Ok in moderation  Preparation H      Tucks        Vaseline lotion applied to tissue with wiping    Herpes:     Throat:  Acyclovir      Oragel  Famvir  Valtrex     Vaccines:         Flu Shot Leg Cramps:       *Gardasil  Benadryl      Hepatitis A         Hepatitis B Nasal Spray:       Pneumovax  Saline Nasal Spray     Polio Booster         Tetanus Nausea:       Tuberculosis test or PPD  Vitamin B6 25 mg TID   AVOID:    Dramamine      *Gardasil  Emetrol       Live Poliovirus  Ginger Root 250 mg QID    MMR (measles, mumps &  High Complex Carbs @ Bedtime    rebella)  Sea Bands-Accupressure    Varicella (Chickenpox)  Unisom 1/2 tab TID     *No known complications           If received before Pain:         Known pregnancy;   Darvocet       Resume series  after  Lortab        Delivery  Percocet    Yeast:   Tramadol      Femstat  Tylenol 3      Gyne-lotrimin  Ultram       Monistat  Vicodin           MISC:         All Sunscreens           Hair Coloring/highlights          Insect Repellant's          (Including DEET)         Mystic Tans  

## 2023-05-24 ENCOUNTER — Ambulatory Visit (INDEPENDENT_AMBULATORY_CARE_PROVIDER_SITE_OTHER): Payer: 59 | Admitting: Internal Medicine

## 2023-05-24 ENCOUNTER — Encounter: Payer: Self-pay | Admitting: Internal Medicine

## 2023-05-24 VITALS — BP 110/76 | HR 65 | Ht 64.0 in | Wt 128.0 lb

## 2023-05-24 DIAGNOSIS — Z0000 Encounter for general adult medical examination without abnormal findings: Secondary | ICD-10-CM | POA: Diagnosis not present

## 2023-05-24 DIAGNOSIS — Z131 Encounter for screening for diabetes mellitus: Secondary | ICD-10-CM | POA: Diagnosis not present

## 2023-05-24 DIAGNOSIS — Z13220 Encounter for screening for lipoid disorders: Secondary | ICD-10-CM

## 2023-05-24 DIAGNOSIS — Z3A09 9 weeks gestation of pregnancy: Secondary | ICD-10-CM

## 2023-05-24 NOTE — Progress Notes (Signed)
Date:  05/24/2023   Name:  Renee Hart   DOB:  November 11, 1990   MRN:  [id number]   Chief Complaint: Annual Exam Toma SHERY SIEBERT is a 32 y.o. female who presents today for her Complete Annual Exam. She feels well. She reports exercising 2 time a week stationary bike 20-30 mins. She reports she is sleeping well. Breast complaints none.  She is currently pregnant and has an initial virtual visit with OB.  She needs a CPX and labs for insurance premium reduction ([employer])   Pap smear: 12/2021 neg/neg   Health Maintenance Due  Topic Date Due   INFLUENZA VACCINE  05/17/2023    Immunization History  Administered Date(s) Administered   Influenza,inj,Quad PF,6+ Mos 07/16/2022   Influenza-Unspecified 06/26/2018, 07/14/2020   PFIZER(Purple Top)SARS-COV-2 Vaccination 10/05/2019, 11/03/2019, 07/16/2020   Tdap 02/23/2021    HPI  Lab Results  Component Value Date   NA 139 12/28/2021   K 4.4 12/28/2021   CO2 24 12/28/2021   GLUCOSE 93 12/28/2021   BUN 10 12/28/2021   CREATININE 0.87 12/28/2021   CALCIUM 9.2 12/28/2021   EGFR 91 12/28/2021   GFRNONAA 101 03/17/2020   No results found for: "CHOL", "HDL", "LDLCALC", "LDLDIRECT", "TRIG", "CHOLHDL" Lab Results  Component Value Date   TSH 3.550 03/17/2020   No results found for: "HGBA1C" Lab Results  Component Value Date   WBC 4.9 12/28/2021   HGB 13.9 12/28/2021   HCT 42.2 12/28/2021   MCV 91 12/28/2021   PLT 250 12/28/2021   Lab Results  Component Value Date   ALT 10 12/28/2021   AST 17 12/28/2021   ALKPHOS 88 12/28/2021   BILITOT 0.6 12/28/2021   No results found for: "25OHVITD2", "25OHVITD3", "VD25OH"   Review of Systems  Constitutional:  Negative for chills, fatigue and fever.  HENT:  Negative for congestion, hearing loss and trouble swallowing.   Eyes:  Negative for visual disturbance.  Respiratory:  Negative for cough, chest tightness, shortness of breath and wheezing.   Cardiovascular:  Negative for  chest pain, palpitations and leg swelling.  Gastrointestinal:  Positive for nausea and vomiting. Negative for abdominal pain, constipation and diarrhea.  Endocrine: Negative for polydipsia and polyuria.  Genitourinary:  Negative for dysuria, frequency and menstrual problem.  Musculoskeletal:  Negative for arthralgias, gait problem and joint swelling.  Skin:  Negative for color change and rash.  Neurological:  Negative for dizziness, light-headedness and headaches.  Hematological:  Negative for adenopathy. Does not bruise/bleed easily.  Psychiatric/Behavioral:  Negative for dysphoric mood and sleep disturbance. The patient is not nervous/anxious.     Patient Active Problem List   Diagnosis Date Noted   Supervision of other normal pregnancy, antepartum 05/17/2023   Amenorrhea 05/01/2023   Pregnancy examination or test, pregnancy unconfirmed 05/01/2023   Old bucket handle tear of lateral meniscus of right knee 11/24/2022   Anxiety disorder 11/24/2022    No Known Allergies  Past Surgical History:  Procedure Laterality Date   CESAREAN SECTION  05/07/2021   Procedure: CESAREAN SECTION;  Surgeon: Hildred Laser, MD;  Location: [facility] ORS;  Service: Obstetrics;;   EYE SURGERY     PRK   KNEE ARTHROSCOPY WITH MEDIAL MENISECTOMY Right 08/28/2018   Procedure: KNEE ARTHROSCOPY WITH MEDIAL OR LATERAL MENISECTOMY;  Surgeon: Juanell Fairly, MD;  Location: [facility] ORS;  Service: Orthopedics;  Laterality: Right;   REFRACTIVE SURGERY      Social History   Tobacco Use   Smoking status: Never  Smokeless tobacco: Never  Vaping Use   Vaping status: Never Used  Substance Use Topics   Alcohol use: Not Currently    Alcohol/week: 2.0 standard drinks of alcohol    Types: 2 Glasses of wine per week    Comment: socially   Drug use: Never     Medication list has been reviewed and updated.  Current Meds  Medication Sig   Prenatal Vit-Fe Fumarate-FA (MULTIVITAMIN-PRENATAL) 27-0.8 MG TABS tablet Take  1 tablet by mouth daily at 12 noon.       05/24/2023   10:32 AM 11/24/2022    2:21 PM  GAD 7 : Generalized Anxiety Score  Nervous, Anxious, on Edge 0 1  Control/stop worrying 0 0  Worry too much - different things 0 1  Trouble relaxing 0 0  Restless 0 0  Easily annoyed or irritable 0 1  Afraid - awful might happen 0 0  Total GAD 7 Score 0 3  Anxiety Difficulty Not difficult at all Not difficult at all       05/24/2023   10:32 AM 11/24/2022    2:21 PM 12/28/2021   10:59 AM  Depression screen PHQ 2/9  Decreased Interest 0 0 0  Down, Depressed, Hopeless 0 0 0  PHQ - 2 Score 0 0 0  Altered sleeping 0 1   Tired, decreased energy 1 1   Change in appetite 0 0   Feeling bad or failure about yourself  0 0   Trouble concentrating 0 0   Moving slowly or fidgety/restless 0 0   Suicidal thoughts 0 0   PHQ-9 Score 1 2   Difficult doing work/chores Not difficult at all Somewhat difficult     BP Readings from Last 3 Encounters:  05/24/23 110/76  05/01/23 129/86  11/24/22 98/72    Physical Exam Vitals and nursing note reviewed.  Constitutional:      General: She is not in acute distress.    Appearance: She is well-developed.  HENT:     Head: Normocephalic and atraumatic.     Right Ear: Tympanic membrane and ear canal normal.     Left Ear: Tympanic membrane and ear canal normal.     Nose:     Right Sinus: No maxillary sinus tenderness.     Left Sinus: No maxillary sinus tenderness.  Eyes:     General: No scleral icterus.       Right eye: No discharge.        Left eye: No discharge.     Conjunctiva/sclera: Conjunctivae normal.  Neck:     Thyroid: No thyromegaly.     Vascular: No carotid bruit.  Cardiovascular:     Rate and Rhythm: Normal rate and regular rhythm.     Pulses: Normal pulses.     Heart sounds: Normal heart sounds.  Pulmonary:     Effort: Pulmonary effort is normal. No respiratory distress.     Breath sounds: No wheezing.  Chest:     Comments:  deferred Abdominal:     General: Bowel sounds are normal.     Palpations: Abdomen is soft.     Tenderness: There is no abdominal tenderness.  Musculoskeletal:     Cervical back: Normal range of motion. No erythema.     Right lower leg: No edema.     Left lower leg: No edema.  Lymphadenopathy:     Cervical: No cervical adenopathy.  Skin:    General: Skin is warm and dry.  Findings: No rash.  Neurological:     Mental Status: She is alert and oriented to person, place, and time.     Cranial Nerves: No cranial nerve deficit.     Sensory: No sensory deficit.     Deep Tendon Reflexes: Reflexes are normal and symmetric.  Psychiatric:        Attention and Perception: Attention normal.        Mood and Affect: Mood normal.     Wt Readings from Last 3 Encounters:  05/24/23 128 lb (58.1 kg)  05/17/23 130 lb (59 kg)  05/01/23 128 lb 8 oz (58.3 kg)    BP 110/76   Pulse 65   Ht 5\' 4"  (1.626 m)   Wt 128 lb (58.1 kg)   LMP 03/23/2023 (Exact Date)   SpO2 99%   BMI 21.97 kg/m   Assessment and Plan:  Problem List Items Addressed This Visit   None Visit Diagnoses     Annual physical exam    -  Primary   Relevant Orders   CBC with Differential/Platelet   Comprehensive metabolic panel   Lipid panel   Hemoglobin A1c   TSH   Screening for lipid disorders       Relevant Orders   Lipid panel   Screening for diabetes mellitus       Relevant Orders   Comprehensive metabolic panel   Hemoglobin A1c   [redacted] weeks gestation of pregnancy       established care with [facility] continue prenatal or at least Folic acid 1 mg daily       No follow-ups on file.    Reubin Milan, MD [facility] [place]

## 2023-06-01 ENCOUNTER — Ambulatory Visit (INDEPENDENT_AMBULATORY_CARE_PROVIDER_SITE_OTHER): Payer: Self-pay

## 2023-06-01 ENCOUNTER — Other Ambulatory Visit: Payer: Self-pay | Admitting: Certified Nurse Midwife

## 2023-06-01 DIAGNOSIS — Z3A09 9 weeks gestation of pregnancy: Secondary | ICD-10-CM

## 2023-06-01 DIAGNOSIS — Z369 Encounter for antenatal screening, unspecified: Secondary | ICD-10-CM

## 2023-06-01 DIAGNOSIS — Z3687 Encounter for antenatal screening for uncertain dates: Secondary | ICD-10-CM

## 2023-06-01 DIAGNOSIS — Z3480 Encounter for supervision of other normal pregnancy, unspecified trimester: Secondary | ICD-10-CM

## 2023-06-14 NOTE — Progress Notes (Deleted)
OBSTETRIC INITIAL PRENATAL VISIT  Subjective:    Renee Hart is being seen today for her first obstetrical visit.  This is a planned pregnancy. She is a 32 y.o. G2P1001 female at [redacted]w[redacted]d gestation, Estimated Date of Delivery: 12/28/23 with Patient's last menstrual period was 03/23/2023 (exact date).,  consistent with *** week sono. Her obstetrical history is significant for macrosomia and previous c-section . Relationship with FOB: spouse, living together. Patient does intend to breast feed. Pregnancy history fully reviewed.    OB History  Gravida Para Term Preterm AB Living  2 1 1  0 0 1  SAB IAB Ectopic Multiple Live Births  0 0 0 0 1    # Outcome Date GA Lbr Len/2nd Weight Sex Type Anes PTL Lv  2 Current           1 Term 05/07/21 [redacted]w[redacted]d / 06:05 9 lb 4.5 oz (4.21 kg) M CS-LTranv EPI  LIV     Name: LORRI, DIPRIMA Praise     Apgar1: 7  Apgar5: 9    Gynecologic History:  Last pap smear was 12/28/2021.  Results were Normal.  denies h/o abnormal pap smears in the past.  denies history of STIs.  Contraception prior to conception:    Past Medical History:  Diagnosis Date   Medical history non-contributory     Family History  Problem Relation Age of Onset   Healthy Mother    Healthy Father    Healthy Sister    Healthy Sister    Ovarian cancer Maternal Grandmother 50   Cancer Maternal Grandmother 108       breast   Healthy Maternal Grandfather    Cancer Paternal Grandmother 69       breast   Healthy Paternal Grandfather     Past Surgical History:  Procedure Laterality Date   CESAREAN SECTION  05/07/2021   Procedure: CESAREAN SECTION;  Surgeon: Hildred Laser, MD;  Location: [facility];  Service: Obstetrics;;   EYE SURGERY     PRK   KNEE ARTHROSCOPY WITH MEDIAL MENISECTOMY Right 08/28/2018   Procedure: KNEE ARTHROSCOPY WITH MEDIAL OR LATERAL MENISECTOMY;  Surgeon: Juanell Fairly, MD;  Location: [facility] OR[facility];  Service: Orthopedics;  Laterality: Right;   REFRACTIVE SURGERY       Social History   Socioeconomic History   Marital status: Married    Spouse name: Chales Abrahams   Number of children: 1   Years of education: 14   Highest education level: Associate degree: occupational, Scientist, product/process development, or vocational program  Occupational History   Occupation: MRI Theme park manager: [employer]    Comment: [place]  Tobacco Use   Smoking status: Never   Smokeless tobacco: Never  Vaping Use   Vaping status: Never Used  Substance and Sexual Activity   Alcohol use: Not Currently    Alcohol/week: 2.0 standard drinks of alcohol    Types: 2 Glasses of wine per week    Comment: socially   Drug use: Never   Sexual activity: Yes    Partners: Male    Birth control/protection: None  Other Topics Concern   Not on file  Social History Narrative   Not on file   Social Determinants of Health   Financial Resource Strain: Low Risk  (05/23/2023)   Overall Financial Resource Strain (CARDIA)    Difficulty of Paying Living Expenses: Not very hard  Food Insecurity: No Food Insecurity (05/23/2023)   Hunger Vital Sign    Worried About Running Out of Food in  the Last Year: Never true    Ran Out of Food in the Last Year: Never true  Transportation Needs: No Transportation Needs (05/23/2023)   PRAPARE - Administrator, Civil Service (Medical): No    Lack of Transportation (Non-Medical): No  Physical Activity: Insufficiently Active (05/23/2023)   Exercise Vital Sign    Days of Exercise per Week: 2 days    Minutes of Exercise per Session: 20 min  Stress: No Stress Concern Present (05/23/2023)   [employer] of Occupational Health - Occupational Stress Questionnaire    Feeling of Stress : Only a little  Social Connections: Moderately Isolated (05/23/2023)   Social Connection and Isolation Panel [NHANES]    Frequency of Communication with Friends and Family: Three times a week    Frequency of Social Gatherings with Friends and Family: Once a week    Attends Religious Services:  Never    Database administrator or Organizations: No    Attends Banker Meetings: Never    Marital Status: Married  Catering manager Violence: Not At Risk (05/17/2023)   Humiliation, Afraid, Rape, and Kick questionnaire    Fear of Current or Ex-Partner: No    Emotionally Abused: No    Physically Abused: No    Sexually Abused: No    Current Outpatient Medications on File Prior to Visit  Medication Sig Dispense Refill   Prenatal Vit-Fe Fumarate-FA (MULTIVITAMIN-PRENATAL) 27-0.8 MG TABS tablet Take 1 tablet by mouth daily at 12 noon.     No current facility-administered medications on file prior to visit.    No Known Allergies   Review of Systems General: Not Present- Fever, Weight Loss and Weight Gain. Skin: Not Present- Rash. HEENT: Not Present- Blurred Vision, Headache and Bleeding Gums. Respiratory: Not Present- Difficulty Breathing. Breast: Not Present- Breast Mass. Cardiovascular: Not Present- Chest Pain, Elevated Blood Pressure, Fainting / Blacking Out and Shortness of Breath. Gastrointestinal: Not Present- Abdominal Pain, Constipation, Nausea and Vomiting. Female Genitourinary: Not Present- Frequency, Painful Urination, Pelvic Pain, Vaginal Bleeding, Vaginal Discharge, Contractions, regular, Fetal Movements Decreased, Urinary Complaints and Vaginal Fluid. Musculoskeletal: Not Present- Back Pain and Leg Cramps. Neurological: Not Present- Dizziness. Psychiatric: Not Present- Depression.     Objective:   Last menstrual period 03/23/2023, currently breastfeeding.  There is no height or weight on file to calculate BMI.  General Appearance:    Alert, cooperative, no distress, appears stated age  Head:    Normocephalic, without obvious abnormality, atraumatic  Eyes:    PERRL, conjunctiva/corneas clear, EOM's intact, both eyes  Ears:    Normal external ear canals, both ears  Nose:   Nares normal, septum midline, mucosa normal, no drainage or sinus tenderness   Throat:   Lips, mucosa, and tongue normal; teeth and gums normal  Neck:   Supple, symmetrical, trachea midline, no adenopathy; thyroid: no enlargement/tenderness/nodules; no carotid bruit or JVD  Back:     Symmetric, no curvature, ROM normal, no CVA tenderness  Lungs:     Clear to auscultation bilaterally, respirations unlabored  Chest Wall:    No tenderness or deformity   Heart:    Regular rate and rhythm, S1 and S2 normal, no murmur, rub or gallop  Breast Exam:    No tenderness, masses, or nipple abnormality  Abdomen:     Soft, non-tender, bowel sounds active all four quadrants, no masses, no organomegaly.  FHT 176  bpm.  Genitalia:    Pelvic:external genitalia normal, vagina without lesions, discharge, or tenderness,  rectovaginal septum  normal. Cervix normal in appearance, no cervical motion tenderness, no adnexal masses or tenderness.  Pregnancy positive findings: uterine enlargement: *** wk size, nontender.   Rectal:    Normal external sphincter.  No hemorrhoids appreciated. Internal exam not done.   Extremities:   Extremities normal, atraumatic, no cyanosis or edema  Pulses:   2+ and symmetric all extremities  Skin:   Skin color, texture, turgor normal, no rashes or lesions  Lymph nodes:   Cervical, supraclavicular, and axillary nodes normal  Neurologic:   CNII-XII intact, normal strength, sensation and reflexes throughout     Assessment:   No diagnosis found.  Plan:   Supervision of other normal pregnancy  - Initial labs reviewed. - Prenatal vitamins encouraged. - Problem list reviewed and updated. - New OB counseling:  The patient has been given an overview regarding routine prenatal care.  Recommendations regarding diet, weight gain, and exercise in pregnancy were given. - Prenatal testing, optional genetic testing, and ultrasound use in pregnancy were reviewed.  Traditional genetic screening vs cell-fee DNA genetic screening discussed, including risks and benefits. Testing  ordered. - Benefits of Breast Feeding were discussed. The patient is encouraged to consider nursing her baby post partum.  There are no diagnoses linked to this encounter.    Follow up in 4 weeks.    Paula Compton, CNM [facility]

## 2023-06-15 ENCOUNTER — Encounter: Payer: Self-pay | Admitting: Obstetrics

## 2023-06-15 ENCOUNTER — Other Ambulatory Visit (HOSPITAL_COMMUNITY)
Admission: RE | Admit: 2023-06-15 | Discharge: 2023-06-15 | Disposition: A | Discharge status: Home / Self Care | Payer: 59 | Source: Ambulatory Visit | Attending: Obstetrics | Admitting: Obstetrics

## 2023-06-15 ENCOUNTER — Ambulatory Visit (INDEPENDENT_AMBULATORY_CARE_PROVIDER_SITE_OTHER): Payer: 59 | Admitting: Obstetrics

## 2023-06-15 VITALS — BP 107/82 | HR 72 | Wt 129.9 lb

## 2023-06-15 DIAGNOSIS — Z3480 Encounter for supervision of other normal pregnancy, unspecified trimester: Secondary | ICD-10-CM | POA: Insufficient documentation

## 2023-06-15 DIAGNOSIS — Z3A12 12 weeks gestation of pregnancy: Secondary | ICD-10-CM

## 2023-06-15 DIAGNOSIS — Z113 Encounter for screening for infections with a predominantly sexual mode of transmission: Secondary | ICD-10-CM

## 2023-06-15 DIAGNOSIS — Z0283 Encounter for blood-alcohol and blood-drug test: Secondary | ICD-10-CM

## 2023-06-15 DIAGNOSIS — Z1379 Encounter for other screening for genetic and chromosomal anomalies: Secondary | ICD-10-CM

## 2023-06-15 LAB — OB RESULTS CONSOLE VARICELLA ZOSTER ANTIBODY, IGG: Varicella: IMMUNE

## 2023-06-15 NOTE — Progress Notes (Signed)
New Obstetric Patient H&P    Chief Complaint: "Desires prenatal care"   History of Present Illness: Patient is a 32 y.o. G2P1001 Not Hispanic or Latino female, LMP 03/23/2023 presents with amenorrhea and positive home pregnancy test. Based on her  LMP, her EDD is Estimated Date of Delivery: 12/28/23 and her EGA is [redacted]w[redacted]d. Cycles are 5. days, regular, and occur approximately every : 28 days. Her last pap smear was 1 years ago and was no abnormalities.    She had a urine pregnancy test which was positive 4 week(s)  ago. Her last menstrual period was normal and lasted for  5 day(s). Since her LMP she claims she has experienced nausea and some fatigue. She denies vaginal bleeding. Her past medical history is noncontributory. Her prior pregnancies are notable for  a primary CS after a lengthy labor and several hours of pushing inn second stage, with a failed vacuum attempt. Her baby weighted over 9 lbs.  Since her LMP, she admits to the use of tobacco products  no She claims she has gained    2 lbs  pounds since the start of her pregnancy.  There are cats in the home in the home  yes If yes Indoor She admits close contact with children on a regular basis  yes  She has had chicken pox in the past yes She has had Tuberculosis exposures, symptoms, or previously tested positive for TB   no Current or past history of domestic violence. no  Genetic Screening/Teratology Counseling: (Includes patient, baby's father, or anyone in either family with:)   1. Patient's age >/= 98 at [facility]  no 2. Thalassemia ([place], [place], Mediterranean, or Asian background): MCV<80  no 3. Neural tube defect (meningomyelocele, spina bifida, anencephaly)  no 4. Congenital heart defect  no  5. Down syndrome  no 6. Tay-Sachs (Jewish, [place]  no 7. Canavan's Disease  no 8. Sickle cell disease or trait (African)  no  9. Hemophilia or other blood disorders  no  10. Muscular dystrophy  no  11. Cystic fibrosis   no  12. Huntington's Chorea  no  13. Mental retardation/autism  possibly . Her nephew has been evaluated, and where autism was a possiblity, now there is a question of CP. 14. Other inherited genetic or chromosomal disorder  no 15. Maternal metabolic disorder (DM, PKU, etc)  no 16. Patient or FOB with a child with a birth defect not listed above no  16a. Patient or FOB with a birth defect themselves not applicable 17. Recurrent pregnancy loss, or stillbirth  no  18. Any medications since LMP other than prenatal vitamins (include vitamins, supplements, OTC meds, drugs, alcohol)  no 19. Any other genetic/environmental exposure to discuss  no  Infection History:   1. Lives with someone with TB or TB exposed  no  2. Patient or partner has history of genital herpes  no 3. Rash or viral illness since LMP  no 4. History of STI (GC, CT, HPV, syphilis, HIV)  no 5. History of recent travel :  no  Other pertinent information:  yes - She had a Cesarean section last time, and will want to consider TOLAC this time.    Review of Systems:10 point review of systems negative unless otherwise noted in HPI  Past Medical History:  Past Medical History:  Diagnosis Date   Medical history non-contributory     Past Surgical History:  Past Surgical History:  Procedure Laterality Date  CESAREAN SECTION  05/07/2021   Procedure: CESAREAN SECTION;  Surgeon: Hildred Laser, MD;  Location: [facility];  Service: Obstetrics;;   EYE SURGERY     PRK   KNEE ARTHROSCOPY WITH MEDIAL MENISECTOMY Right 08/28/2018   Procedure: KNEE ARTHROSCOPY WITH MEDIAL OR LATERAL MENISECTOMY;  Surgeon: Juanell Fairly, MD;  Location: [facility] OR[facility];  Service: Orthopedics;  Laterality: Right;   REFRACTIVE SURGERY      Gynecologic History: Patient's last menstrual period was 03/23/2023 (exact date).  Obstetric History: G2P1001  Family History:  Family History  Problem Relation Age of Onset   Healthy Mother    Healthy Father     Healthy Sister    Healthy Sister    Ovarian cancer Maternal Grandmother 50   Cancer Maternal Grandmother 92       breast   Healthy Maternal Grandfather    Cancer Paternal Grandmother 78       breast   Healthy Paternal Grandfather     Social History:  Social History   Socioeconomic History   Marital status: Married    Spouse name: Chales Abrahams   Number of children: 1   Years of education: 14   Highest education level: Tax adviser degree: occupational, Scientist, product/process development, or vocational program  Occupational History   Occupation: MRI Theme park manager: [employer]    Comment: [employer]  Tobacco Use   Smoking status: Never   Smokeless tobacco: Never  Vaping Use   Vaping status: Never Used  Substance and Sexual Activity   Alcohol use: Not Currently    Alcohol/week: 2.0 standard drinks of alcohol    Types: 2 Glasses of wine per week    Comment: socially   Drug use: Never   Sexual activity: Yes    Partners: Male    Birth control/protection: None  Other Topics Concern   Not on file  Social History Narrative   Not on file   Social Determinants of Health   Financial Resource Strain: Low Risk  (05/23/2023)   Overall Financial Resource Strain (CARDIA)    Difficulty of Paying Living Expenses: Not very hard  Food Insecurity: No Food Insecurity (05/23/2023)   Hunger Vital Sign    Worried About Running Out of Food in the Last Year: Never true    Ran Out of Food in the Last Year: Never true  Transportation Needs: No Transportation Needs (05/23/2023)   PRAPARE - Administrator, Civil Service (Medical): No    Lack of Transportation (Non-Medical): No  Physical Activity: Insufficiently Active (05/23/2023)   Exercise Vital Sign    Days of Exercise per Week: 2 days    Minutes of Exercise per Session: 20 min  Stress: No Stress Concern Present (05/23/2023)   [employer] of Occupational Health - Occupational Stress Questionnaire    Feeling of Stress : Only a little  Social Connections:  Moderately Isolated (05/23/2023)   Social Connection and Isolation Panel [NHANES]    Frequency of Communication with Friends and Family: Three times a week    Frequency of Social Gatherings with Friends and Family: Once a week    Attends Religious Services: Never    Database administrator or Organizations: No    Attends Banker Meetings: Never    Marital Status: Married  Catering manager Violence: Not At Risk (05/17/2023)   Humiliation, Afraid, Rape, and Kick questionnaire    Fear of Current or Ex-Partner: No    Emotionally Abused: No    Physically Abused: No  Sexually Abused: No    Allergies:  No Known Allergies  Medications: Prior to Admission medications   Medication Sig Start Date End Date Taking? Authorizing Provider  Prenatal Vit-Fe Fumarate-FA (MULTIVITAMIN-PRENATAL) 27-0.8 MG TABS tablet Take 1 tablet by mouth daily at 12 noon.    [provider]    Physical Exam Vitals: Last menstrual period 03/23/2023, currently breastfeeding.  General: NAD HEENT: normocephalic, anicteric Thyroid: no enlargement, no palpable nodules Pulmonary: No increased work of breathing, CTAB Cardiovascular: RRR, distal pulses 2+ Abdomen: NABS, soft, non-tender, non-distended.  Umbilicus without lesions.  No hepatomegaly, splenomegaly or masses palpable. No evidence of hernia  Genitourinary:  External: Normal external female genitalia.  Normal urethral meatus, normal  Bartholin's and Skene's glands.    Vagina: Normal vaginal mucosa, no evidence of prolapse.    Cervix: Grossly normal in appearance, no bleeding  Uterus: anteverted-enlarged, mobile, normal contour.  No CMT + FHTS  Adnexa: ovaries non-enlarged, no adnexal masses  Rectal: deferred Extremities: no edema, erythema, or tenderness Neurologic: Grossly intact Psychiatric: mood appropriate, affect full   Assessment: 32 y.o. G2P1001 at [redacted]w[redacted]d presenting to initiate prenatal care Hx of Cesarean section- interested in  TOLAC  Plan: 1) Avoid alcoholic beverages. 2) Patient encouraged not to smoke.  3) Discontinue the use of all non-medicinal drugs and chemicals.  4) Take prenatal vitamins daily.  5) Nutrition, food safety (fish, cheese advisories, and high nitrite foods) and exercise discussed. 6) Hospital and practice style discussed with cross coverage system.  7) Genetic Screening, such as with 1st Trimester Screening, cell free fetal DNA, AFP testing, and Ultrasound, as well as with amniocentesis and CVS as appropriate, is discussed with patient. At the conclusion of today's visit patient requested genetic testing 8) Patient is asked about travel to areas at risk for the Zika virus, and counseled to avoid travel and exposure to mosquitoes or sexual partners who may have themselves been exposed to the virus. Testing is discussed, and will be ordered as appropriate.   She plans to breastfeed.  We discussed our support for VBAC, and can monitor her baby for growth this pregnancy. She gained over 50 lbs with her first, so plans to be careful this time. Her labs have been drawn today. No pap needed, and the aptima swab was retrieved.  RTC in 4 weeks for ROB and to review her labs.  Mirna Mires, CNM  06/15/2023 12:28 PM

## 2023-06-16 LAB — URINALYSIS, ROUTINE W REFLEX MICROSCOPIC
Bilirubin, UA: NEGATIVE
Glucose, UA: NEGATIVE
Ketones, UA: NEGATIVE
Leukocytes,UA: NEGATIVE
Nitrite, UA: NEGATIVE
Protein,UA: NEGATIVE
RBC, UA: NEGATIVE
Specific Gravity, UA: 1.013 (ref 1.005–1.030)
Urobilinogen, Ur: 0.2 mg/dL (ref 0.2–1.0)
pH, UA: 7 (ref 5.0–7.5)

## 2023-06-17 LAB — CULTURE, OB URINE

## 2023-06-17 LAB — URINE CULTURE, OB REFLEX: Organism ID, Bacteria: NO GROWTH

## 2023-06-19 LAB — MONITOR DRUG PROFILE 14(MW)
Amphetamine Scrn, Ur: NEGATIVE ng/mL
BARBITURATE SCREEN URINE: NEGATIVE ng/mL
BENZODIAZEPINE SCREEN, URINE: NEGATIVE ng/mL
Buprenorphine, Urine: NEGATIVE ng/mL
CANNABINOIDS UR QL SCN: NEGATIVE ng/mL
Cocaine (Metab) Scrn, Ur: NEGATIVE ng/mL
Creatinine(Crt), U: 40.6 mg/dL (ref 20.0–300.0)
Fentanyl, Urine: NEGATIVE pg/mL
Meperidine Screen, Urine: NEGATIVE ng/mL
Methadone Screen, Urine: NEGATIVE ng/mL
OXYCODONE+OXYMORPHONE UR QL SCN: NEGATIVE ng/mL
Opiate Scrn, Ur: NEGATIVE ng/mL
Ph of Urine: 6.8 (ref 4.5–8.9)
Phencyclidine Qn, Ur: NEGATIVE ng/mL
Propoxyphene Scrn, Ur: NEGATIVE ng/mL
SPECIFIC GRAVITY: 1.015
Tramadol Screen, Urine: NEGATIVE ng/mL

## 2023-06-19 LAB — NICOTINE SCREEN, URINE: Cotinine Ql Scrn, Ur: NEGATIVE ng/mL

## 2023-06-20 LAB — HGB FRACTIONATION CASCADE
Hgb A2: 2.7 % (ref 1.8–3.2)
Hgb A: 97.3 % (ref 96.4–98.8)
Hgb F: 0 % (ref 0.0–2.0)
Hgb S: 0 %

## 2023-06-20 LAB — CBC/D/PLT+RPR+RH+ABO+RUBIGG...
Antibody Screen: NEGATIVE
Basophils Absolute: 0 10*3/uL (ref 0.0–0.2)
Basos: 0 %
EOS (ABSOLUTE): 0.1 10*3/uL (ref 0.0–0.4)
Eos: 1 %
HCV Ab: NONREACTIVE
HIV Screen 4th Generation wRfx: NONREACTIVE
Hematocrit: 39.9 % (ref 34.0–46.6)
Hemoglobin: 13.5 g/dL (ref 11.1–15.9)
Hepatitis B Surface Ag: NEGATIVE
Immature Grans (Abs): 0 10*3/uL (ref 0.0–0.1)
Immature Granulocytes: 0 %
Lymphocytes Absolute: 1.1 10*3/uL (ref 0.7–3.1)
Lymphs: 14 %
MCH: 32.2 pg (ref 26.6–33.0)
MCHC: 33.8 g/dL (ref 31.5–35.7)
MCV: 95 fL (ref 79–97)
Monocytes Absolute: 0.4 10*3/uL (ref 0.1–0.9)
Monocytes: 6 %
Neutrophils Absolute: 6 10*3/uL (ref 1.4–7.0)
Neutrophils: 79 %
Platelets: 194 10*3/uL (ref 150–450)
RBC: 4.19 x10E6/uL (ref 3.77–5.28)
RDW: 11.1 % — ABNORMAL LOW (ref 11.7–15.4)
RPR Ser Ql: NONREACTIVE
Rh Factor: POSITIVE
Rubella Antibodies, IGG: 12.6 {index} (ref >0.99)
Varicella zoster IgG: 1530 {index} (ref >165)
WBC: 7.6 10*3/uL (ref 3.4–10.8)

## 2023-06-20 LAB — HCV INTERPRETATION

## 2023-06-21 LAB — MATERNIT 21 PLUS CORE, BLOOD
Fetal Fraction: 8
Result (T21): NEGATIVE
Trisomy 13 (Patau syndrome): NEGATIVE
Trisomy 18 (Edwards syndrome): NEGATIVE
Trisomy 21 (Down syndrome): NEGATIVE

## 2023-06-21 LAB — CERVICOVAGINAL ANCILLARY ONLY
Chlamydia: NEGATIVE
Comment: NEGATIVE
Comment: NORMAL
Neisseria Gonorrhea: NEGATIVE

## 2023-07-05 ENCOUNTER — Encounter: Payer: Self-pay | Admitting: Obstetrics

## 2023-07-12 ENCOUNTER — Encounter: Payer: 59 | Admitting: Licensed Practical Nurse

## 2023-07-12 ENCOUNTER — Ambulatory Visit: Payer: 59 | Admitting: Licensed Practical Nurse

## 2023-07-12 VITALS — BP 106/69 | HR 78 | Wt 133.6 lb

## 2023-07-12 DIAGNOSIS — Z3A15 15 weeks gestation of pregnancy: Secondary | ICD-10-CM

## 2023-07-12 DIAGNOSIS — Z3480 Encounter for supervision of other normal pregnancy, unspecified trimester: Secondary | ICD-10-CM

## 2023-07-12 LAB — POCT URINALYSIS DIPSTICK OB
Bilirubin, UA: NEGATIVE
Blood, UA: NEGATIVE
Glucose, UA: NEGATIVE
Ketones, UA: NEGATIVE
Leukocytes, UA: NEGATIVE
Nitrite, UA: NEGATIVE
Spec Grav, UA: 1.015 (ref 1.010–1.025)
Urobilinogen, UA: 0.2 E.U./dL
pH, UA: 6.5 (ref 5.0–8.0)

## 2023-07-12 NOTE — Progress Notes (Signed)
ROB [redacted]w[redacted]d: She is doing well. She thinks she is having some round ligament pain at night on and off. She feels flutters, but no movement yet. She has no other concerns today.

## 2023-07-12 NOTE — Progress Notes (Signed)
Routine Prenatal Care Visit  Subjective  Renee Hart is a 32 y.o. G2P1001 at [redacted]w[redacted]d being seen today for ongoing prenatal care.  She is currently monitored for the following issues for this low-risk pregnancy and has Old bucket handle tear of lateral meniscus of right knee; Anxiety disorder; Amenorrhea; Pregnancy examination or test, pregnancy unconfirmed; and Supervision of other normal pregnancy, antepartum on their problem list.  ----------------------------------------------------------------------------------- Patient reports  round ligament pain .  Nausea is improving.  -Breastfed her first child, would like to do so again. Had multiple episodes of mastitis and and abscess.  -will get flu vaccine at work  -desires TOLAC as long fetus is measuring on small side.   Contractions: Not present. Vag. Bleeding: None.  Movement: Absent. Leaking Fluid denies.  ----------------------------------------------------------------------------------- The following portions of the patient's history were reviewed and updated as appropriate: allergies, current medications, past family history, past medical history, past social history, past surgical history and problem list. Problem list updated.  Objective  Blood pressure 106/69, pulse 78, weight 133 lb 9.6 oz (60.6 kg), last menstrual period 03/23/2023, currently breastfeeding. Pregravid weight 128 lb (58.1 kg) Total Weight Gain 5 lb 9.6 oz (2.54 kg) Urinalysis: Urine Protein    Urine Glucose    Fetal Status: Fetal Heart Rate (bpm): 150   Movement: Absent     General:  Alert, oriented and cooperative. Patient is in no acute distress.  Skin: Skin is warm and dry. No rash noted.   Cardiovascular: Normal heart rate noted  Respiratory: Normal respiratory effort, no problems with respiration noted  Abdomen: Soft, gravid, appropriate for gestational age. Pain/Pressure: Absent     Pelvic:  Cervical exam deferred        Extremities: Normal range of  motion.  Edema: None  Mental Status: Normal mood and affect. Normal behavior. Normal judgment and thought content.   Assessment   32 y.o. G2P1001 at [redacted]w[redacted]d by  12/28/2023, Date entered prior to episode creation presenting for routine prenatal visit  Plan   second Problems (from 05/17/23 to present)     Problem Noted Resolved   Supervision of other normal pregnancy, antepartum 05/17/2023 by Loran Senters, CMA No   Overview Addendum 07/12/2023  8:11 AM by Tommie Raymond, CMA     Clinical Staff Provider  Office Location  [facility] Dating  Not found.  Language  English Anatomy US    Flu Vaccine  offer Genetic Screen  NIPS: neg, xy  TDaP vaccine   offer Hgb A1C or  GTT Early : Third trimester :   Covid No boosters   LAB RESULTS   Rhogam  O/Positive/-- (08/30 1114)  Blood Type O/Positive/-- (08/30 1114)   Feeding Plan breast Antibody Negative (08/30 1114)  Contraception pill Rubella 12.60 (08/30 1114)  Circumcision yes RPR Non Reactive (08/30 1114)   Pediatrician  [facility] HBsAg Negative (08/30 1114)   Support Person Chales Abrahams HIV Non Reactive (08/30 1114)  Prenatal Classes Preg program via [facility] Varicella     GBS  (For PCN allergy, check sensitivities)   BTL Consent  Hep C Non Reactive (08/30 1114)   VBAC Consent  Pap Diagnosis  Date Value Ref Range Status  12/28/2021   Final   - Negative for intraepithelial lesion or malignancy (NILM)      Hgb Electro      CF      SMA  Preterm labor symptoms and general obstetric precautions including but not limited to vaginal bleeding, contractions, leaking of fluid and fetal movement were reviewed in detail with the patient. Please refer to After Visit Summary for other counseling recommendations.   Return in about 4 weeks (around 08/09/2023) for ROB.  Anatomy US ordered   Jannifer Hick   [facility]  07/12/23  8:37 AM

## 2023-08-08 ENCOUNTER — Ambulatory Visit
Admission: RE | Admit: 2023-08-08 | Discharge: 2023-08-08 | Disposition: A | Discharge status: Home / Self Care | Payer: 59 | Source: Ambulatory Visit | Attending: Licensed Practical Nurse | Admitting: Licensed Practical Nurse

## 2023-08-08 DIAGNOSIS — Z3A19 19 weeks gestation of pregnancy: Secondary | ICD-10-CM | POA: Diagnosis not present

## 2023-08-08 DIAGNOSIS — Z3480 Encounter for supervision of other normal pregnancy, unspecified trimester: Secondary | ICD-10-CM | POA: Diagnosis present

## 2023-08-08 DIAGNOSIS — O321XX1 Maternal care for breech presentation, fetus 1: Secondary | ICD-10-CM | POA: Diagnosis not present

## 2023-08-08 DIAGNOSIS — O321XX0 Maternal care for breech presentation, not applicable or unspecified: Secondary | ICD-10-CM | POA: Insufficient documentation

## 2023-08-10 ENCOUNTER — Ambulatory Visit (INDEPENDENT_AMBULATORY_CARE_PROVIDER_SITE_OTHER): Payer: 59 | Admitting: Obstetrics

## 2023-08-10 VITALS — BP 97/68 | HR 90 | Wt 138.0 lb

## 2023-08-10 DIAGNOSIS — Z3A20 20 weeks gestation of pregnancy: Secondary | ICD-10-CM

## 2023-08-10 DIAGNOSIS — Z3480 Encounter for supervision of other normal pregnancy, unspecified trimester: Secondary | ICD-10-CM

## 2023-08-10 LAB — POCT URINALYSIS DIPSTICK OB
Bilirubin, UA: NEGATIVE
Blood, UA: NEGATIVE
Glucose, UA: NEGATIVE
Ketones, UA: NEGATIVE
Leukocytes, UA: NEGATIVE
Nitrite, UA: NEGATIVE
Urobilinogen, UA: 0.2 U/dL
pH, UA: 6.5 (ref 5.0–8.0)

## 2023-08-10 NOTE — Progress Notes (Signed)
Routine Prenatal Care Visit  Subjective  Renee Hart is a 31 y.o. G2P1001 at [redacted]w[redacted]d being seen today for ongoing prenatal care.  She is currently monitored for the following issues for this low-risk pregnancy and has Old bucket handle tear of lateral meniscus of right knee; Anxiety disorder; Amenorrhea; Pregnancy examination or test, pregnancy unconfirmed; and Supervision of other normal pregnancy, antepartum on their problem list.  ----------------------------------------------------------------------------------- Patient reports no complaints.  She is aware of fetal movement. Still considering TOLAC.She had an anatomy scan but there is no report available. Contractions: Not present. Vag. Bleeding: None.  Movement: Present. Leaking Fluid denies.  ----------------------------------------------------------------------------------- The following portions of the patient's history were reviewed and updated as appropriate: allergies, current medications, past family history, past medical history, past social history, past surgical history and problem list. Problem list updated.  Objective  Blood pressure 97/68, pulse 90, weight 138 lb (62.6 kg), last menstrual period 03/23/2023, currently breastfeeding. Pregravid weight 128 lb (58.1 kg) Total Weight Gain 10 lb (4.536 kg) Urinalysis: Urine Protein    Urine Glucose    Fetal Status: Fetal Heart Rate (bpm): 152 Fundal Height: 20 cm Movement: Present     General:  Alert, oriented and cooperative. Patient is in no acute distress.  Skin: Skin is warm and dry. No rash noted.   Cardiovascular: Normal heart rate noted  Respiratory: Normal respiratory effort, no problems with respiration noted  Abdomen: Soft, gravid, appropriate for gestational age. Pain/Pressure: Absent     Pelvic:  Cervical exam deferred        Extremities: Normal range of motion.  Edema: None  Mental Status: Normal mood and affect. Normal behavior. Normal judgment and thought  content.   Assessment   32 y.o. G2P1001 at [redacted]w[redacted]d by  12/28/2023, Date entered prior to episode creation presenting for routine prenatal visit  Plan   second Problems (from 05/17/23 to present)     Problem Noted Resolved   Supervision of other normal pregnancy, antepartum 05/17/2023 by Loran Senters, CMA No   Overview Addendum 08/10/2023  8:15 AM by Donnetta Hail, CMA     Clinical Staff Provider  Office Location  [facility] Dating  NL and 9wkus  Language  English Anatomy US    Flu Vaccine  07/23/23  Genetic Screen  NIPS: neg, xy  TDaP vaccine   offer Hgb A1C or  GTT Early : Third trimester :   Covid No boosters   LAB RESULTS   Rhogam  O/Positive/-- (08/30 1114)  Blood Type O/Positive/-- (08/30 1114)   Feeding Plan breast Antibody Negative (08/30 1114)  Contraception pill Rubella 12.60 (08/30 1114)  Circumcision yes RPR Non Reactive (08/30 1114)   Pediatrician  [facility] [place] HBsAg Negative (08/30 1114)   Support Person Chales Abrahams HIV Non Reactive (08/30 1114)  Prenatal Classes Preg program via [facility] Varicella Immune    GBS  (For PCN allergy, check sensitivities)   BTL Consent  Hep C Non Reactive (08/30 1114)   VBAC Consent  Pap Diagnosis  Date Value Ref Range Status  12/28/2021   Final   - Negative for intraepithelial lesion or malignancy (NILM)      Hgb Electro      CF      SMA                    Preterm labor symptoms and general obstetric precautions including but not limited to vaginal bleeding, contractions, leaking of fluid and fetal movement were reviewed in detail  with the patient. Please refer to After Visit Summary for other counseling recommendations.  Will look for her sono report. She is interested in VBAC. Discussed confirming vertex at 36 weeks, exercises to encourage optimal fetal positioning.  Return in about 4 weeks (around 09/07/2023) for return OB.  Mirna Mires, CNM  08/10/2023 8:48 AM

## 2023-08-21 ENCOUNTER — Encounter: Payer: Self-pay | Admitting: Obstetrics

## 2023-09-03 ENCOUNTER — Ambulatory Visit: Payer: 59 | Admitting: Dermatology

## 2023-09-03 DIAGNOSIS — D2239 Melanocytic nevi of other parts of face: Secondary | ICD-10-CM

## 2023-09-03 DIAGNOSIS — L820 Inflamed seborrheic keratosis: Secondary | ICD-10-CM | POA: Diagnosis not present

## 2023-09-03 DIAGNOSIS — D492 Neoplasm of unspecified behavior of bone, soft tissue, and skin: Secondary | ICD-10-CM | POA: Diagnosis not present

## 2023-09-03 DIAGNOSIS — D225 Melanocytic nevi of trunk: Secondary | ICD-10-CM

## 2023-09-03 DIAGNOSIS — D229 Melanocytic nevi, unspecified: Secondary | ICD-10-CM

## 2023-09-03 DIAGNOSIS — D239 Other benign neoplasm of skin, unspecified: Secondary | ICD-10-CM

## 2023-09-03 HISTORY — DX: Other benign neoplasm of skin, unspecified: D23.9

## 2023-09-03 NOTE — Progress Notes (Signed)
New Patient Visit   Subjective  Renee Hart is a 32 y.o. female who presents for the following: check spot chin, hx of bx in past, benign nevus bx proven, pt feels like it is coming back, check mole R side has had for yrs, did itch in past but no changes, mole pubic area getting larger, skin tags groin, can be painful when she wipes, no hx of skin cancer, father with hx of skin cancer not sure type The patient has spots, moles and lesions to be evaluated, some may be new or changing and the patient may have concern these could be cancer. Pt is pregnant and due March 25   The following portions of the chart were reviewed this encounter and updated as appropriate: medications, allergies, medical history  Review of Systems:  No other skin or systemic complaints except as noted in HPI or Assessment and Plan.  Objective  Well appearing patient in no apparent distress; mood and affect are within normal limits.   A focused examination was performed of the following areas: Face, groin, trunk  Relevant exam findings are noted in the Assessment and Plan.  R super pubic vulva 6.52mm thin brown papule     R perineal x 1, L perineal x 1 (2) Fleshy tan waxy papules    Assessment & Plan   MELANOCYTIC NEVUS Bx proven 02/12/2018 Exam: recurrent thin flesh papule mid chin  Treatment Plan: Benign appearing on exam today. Recommend observation for now, discussed repeat shave removal in future after pt has baby.  MELANOCYTIC NEVI R flank braline, super pubic Exam:  R flank braline regular pink/brown thin papule L upper lip flesh papule  Treatment Plan: Benign appearing on exam today. Recommend observation. Call clinic for new or changing moles. Recommend daily use of broad spectrum spf 30+ sunscreen to sun-exposed areas.   Discussed shave removal L upper lip nevus since bothersome to pt    Neoplasm of skin R super pubic vulva  Epidermal / dermal shaving  Lesion diameter  (cm):  0.6 Informed consent: discussed and consent obtained   Patient was prepped and draped in usual sterile fashion: area prepped with alcohol. Anesthesia: the lesion was anesthetized in a standard fashion   Anesthetic:  1% lidocaine w/ epinephrine 1-100,000 buffered w/ 8.4% NaHCO3 Instrument used: flexible razor blade   Hemostasis achieved with: pressure, aluminum chloride and electrodesiccation   Outcome: patient tolerated procedure well   Post-procedure details: wound care instructions given   Post-procedure details comment:  Ointment and small bandage applied  Specimen 1 - Surgical pathology Differential Diagnosis: D48.5 Nevus vs SK r/o Atypia  Check Margins: No 6.72mm brown macule, hx of growing  Inflamed seborrheic keratosis (2) R perineal x 1, L perineal x 1  Vs Skin tags Symptomatic, irritating, patient would like treated.  Destruction of lesion - R perineal x 1, L perineal x 1 (2)  Destruction method: cryotherapy   Informed consent: discussed and consent obtained   Lesion destroyed using liquid nitrogen: Yes   Region frozen until ice ball extended beyond lesion: Yes   Outcome: patient tolerated procedure well with no complications   Post-procedure details: wound care instructions given   Additional details:  Prior to procedure, discussed risks of blister formation, small wound, skin dyspigmentation, or rare scar following cryotherapy. Recommend Vaseline ointment to treated areas while healing.   Nevus    Return if symptoms worsen or fail to improve.  I, Ardis Rowan, RMA, am acting as scribe for  Willeen Niece, MD .   Documentation: I have reviewed the above documentation for accuracy and completeness, and I agree with the above.  Willeen Niece, MD

## 2023-09-03 NOTE — Patient Instructions (Addendum)

## 2023-09-05 DIAGNOSIS — Z3483 Encounter for supervision of other normal pregnancy, third trimester: Secondary | ICD-10-CM | POA: Diagnosis not present

## 2023-09-05 DIAGNOSIS — Z3482 Encounter for supervision of other normal pregnancy, second trimester: Secondary | ICD-10-CM | POA: Diagnosis not present

## 2023-09-05 LAB — SURGICAL PATHOLOGY

## 2023-09-07 ENCOUNTER — Ambulatory Visit (INDEPENDENT_AMBULATORY_CARE_PROVIDER_SITE_OTHER): Payer: 59 | Admitting: Obstetrics

## 2023-09-07 ENCOUNTER — Encounter: Payer: Self-pay | Admitting: Obstetrics

## 2023-09-07 VITALS — BP 111/69 | HR 72 | Wt 141.0 lb

## 2023-09-07 DIAGNOSIS — Z3480 Encounter for supervision of other normal pregnancy, unspecified trimester: Secondary | ICD-10-CM

## 2023-09-07 NOTE — Progress Notes (Signed)
    Return Prenatal Note   Assessment/Plan   Plan  32 y.o. G2P1001 at [redacted]w[redacted]d presents for follow-up OB visit. Reviewed prenatal record including previous visit note.  Supervision of other normal pregnancy, antepartum -Normal weight gain and fundal height. Reviewed healthy food choices -Anticipatory guidance about late second trimester and fetal development -Discussed preparation for 28-week labs -Will see MD next to discuss TOLAC   No orders of the defined types were placed in this encounter.  Return in about 4 weeks (around 10/05/2023).   Future Appointments  Date Time Provider Department Center  02/11/2024  8:30 AM Willeen Niece, MD [facility] None  05/26/2024  8:20 AM Reubin Milan, MD [facility]    For next visit:  ROB with 1 hour gluocla, third trimester labs, and Tdap     Subjective   Renee Hart is feeling great. She is concerned about her weight gain because people have been telling her she looks too small. She is fairly certain she wants a TOLAC but is not completely decided.  Movement: Present Contractions: Not present  Objective   Flow sheet Vitals: Pulse Rate: 72 BP: 111/69 Fundal Height: 24 cm Fetal Heart Rate (bpm): 148 Total weight gain: 13 lb (5.897 kg)  General Appearance  No acute distress, well appearing, and well nourished Pulmonary   Normal work of breathing Neurologic   Alert and oriented to person, place, and time Psychiatric   Mood and affect within normal limits  Renee Hart, CNM 09/07/23 8:39 AM

## 2023-09-07 NOTE — Assessment & Plan Note (Addendum)
-  Normal weight gain and fundal height. Reviewed healthy food choices -Anticipatory guidance about late second trimester and fetal development -Discussed preparation for 28-week labs -Will see MD next to discuss TOLAC

## 2023-09-10 ENCOUNTER — Telehealth: Payer: Self-pay

## 2023-09-10 NOTE — Telephone Encounter (Signed)
Lft pt msg to call for bx results/sh

## 2023-09-10 NOTE — Telephone Encounter (Signed)
-----   Message from Willeen Niece sent at 09/10/2023 12:37 PM EST ----- 1. Skin, R super pubic vulva :       DYSPLASTIC COMPOUND NEVUS WITH MODERATE ATYPIA, DEEP MARGIN INVOLVED   Moderately atypical mole, will observe - please call patient

## 2023-09-11 NOTE — Telephone Encounter (Signed)
Advised pt of bx results/sh ?

## 2023-10-01 ENCOUNTER — Encounter: Payer: Self-pay | Admitting: Internal Medicine

## 2023-10-03 ENCOUNTER — Encounter: Payer: Self-pay | Admitting: Obstetrics and Gynecology

## 2023-10-03 ENCOUNTER — Ambulatory Visit (INDEPENDENT_AMBULATORY_CARE_PROVIDER_SITE_OTHER): Payer: 59 | Admitting: Obstetrics and Gynecology

## 2023-10-03 ENCOUNTER — Other Ambulatory Visit: Payer: Self-pay

## 2023-10-03 ENCOUNTER — Other Ambulatory Visit: Payer: 59

## 2023-10-03 VITALS — BP 100/54 | HR 88 | Wt 154.0 lb

## 2023-10-03 DIAGNOSIS — Z23 Encounter for immunization: Secondary | ICD-10-CM

## 2023-10-03 DIAGNOSIS — O09299 Supervision of pregnancy with other poor reproductive or obstetric history, unspecified trimester: Secondary | ICD-10-CM

## 2023-10-03 DIAGNOSIS — D649 Anemia, unspecified: Secondary | ICD-10-CM | POA: Diagnosis not present

## 2023-10-03 DIAGNOSIS — Z113 Encounter for screening for infections with a predominantly sexual mode of transmission: Secondary | ICD-10-CM

## 2023-10-03 DIAGNOSIS — Z3482 Encounter for supervision of other normal pregnancy, second trimester: Secondary | ICD-10-CM

## 2023-10-03 DIAGNOSIS — Z131 Encounter for screening for diabetes mellitus: Secondary | ICD-10-CM | POA: Diagnosis not present

## 2023-10-03 DIAGNOSIS — O34219 Maternal care for unspecified type scar from previous cesarean delivery: Secondary | ICD-10-CM | POA: Insufficient documentation

## 2023-10-03 DIAGNOSIS — Z3480 Encounter for supervision of other normal pregnancy, unspecified trimester: Secondary | ICD-10-CM

## 2023-10-03 DIAGNOSIS — Z3A27 27 weeks gestation of pregnancy: Secondary | ICD-10-CM

## 2023-10-03 HISTORY — DX: Supervision of pregnancy with other poor reproductive or obstetric history, unspecified trimester: O09.299

## 2023-10-03 NOTE — Progress Notes (Signed)
ROB [redacted]w[redacted]d: She is doing well. She reports good fetal movement and has no new concerns today. TDAP/BTC/GTT done today.

## 2023-10-03 NOTE — Progress Notes (Signed)
ROB: Patient is a 32 y.o. G2P1001 at [redacted]w[redacted]d who presents for routine OB care.  Pregnancy is complicated by   Anxiety disorder; history of macrosomia in prior pregnancy, history of cesarean delivery affecting pregnancy, and Supervision of other normal pregnancy, antepartum on their problem list.   Patient denies complaints today, doing well. Notes good fetal movement, denies vaginal bleeding, ctx.  For 28 week labs today.  Plans to breastfeed, desires Progesterone only pills for contraception. For Tdap today, signed blood consent.    Patient with h/o prior C-section x 1 for arrest of descent, fetal malpositioning, also had macrosomia (9.5 lbs). Considering trial of labor. TOLAC counseling performed today: Discussed risks vs benefits of TOLAC vs repeat C-section.  Risk of uterine rupture at term is ~ 1%.  Risk of failed trial of labor after cesarean (TOLAC) without a vaginal birth after cesarean (VBAC) resulting in repeat cesarean delivery (RCD) in about 20 to 40 percent of women who attempt VBAC.  Risk of requiring emergency C-section due to uterine rupture discussed. The benefits of a trial of labor after cesarean (TOLAC) resulting in a vaginal birth after cesarean (VBAC) include the following: shorter length of hospital stay and postpartum recovery (in most cases); fewer complications, such as postpartum fever, wound or uterine infection, thromboembolism (blood clots in the leg or lung), need for blood transfusion and fewer neonatal breathing problems.  Patient notes understanding.  Would like to think over her options, notes she would like to make a decision later in third trimester depending on size of current fetus.  Can f/u with [place] at 35-36 weeks. Included info in AVS and given TOLAC consent form to review. RTC in 2 weeks.

## 2023-10-04 ENCOUNTER — Encounter: Payer: Self-pay | Admitting: Obstetrics

## 2023-10-04 LAB — 28 WEEK RH+PANEL
Basophils Absolute: 0 10*3/uL (ref 0.0–0.2)
Basos: 0 %
EOS (ABSOLUTE): 0.1 10*3/uL (ref 0.0–0.4)
Eos: 1 %
Gestational Diabetes Screen: 101 mg/dL (ref 70–139)
HIV Screen 4th Generation wRfx: NONREACTIVE
Hematocrit: 35 % (ref 34.0–46.6)
Hemoglobin: 11.6 g/dL (ref 11.1–15.9)
Immature Grans (Abs): 0.1 10*3/uL (ref 0.0–0.1)
Immature Granulocytes: 1 %
Lymphocytes Absolute: 1 10*3/uL (ref 0.7–3.1)
Lymphs: 11 %
MCH: 32 pg (ref 26.6–33.0)
MCHC: 33.1 g/dL (ref 31.5–35.7)
MCV: 96 fL (ref 79–97)
Monocytes Absolute: 0.5 10*3/uL (ref 0.1–0.9)
Monocytes: 6 %
Neutrophils Absolute: 7.1 10*3/uL — ABNORMAL HIGH (ref 1.4–7.0)
Neutrophils: 81 %
Platelets: 203 10*3/uL (ref 150–450)
RBC: 3.63 x10E6/uL — ABNORMAL LOW (ref 3.77–5.28)
RDW: 11.2 % — ABNORMAL LOW (ref 11.7–15.4)
RPR Ser Ql: NONREACTIVE
WBC: 8.7 10*3/uL (ref 3.4–10.8)

## 2023-10-17 NOTE — L&D Delivery Note (Signed)
 Obstetrical Delivery Note   Date of Delivery:   12/26/2023 Primary OB:   AOB Gestational Age/EDD: [redacted]w[redacted]d (Dated by LMP) Reason for Admission: SROM  Antepartum complications: hx LTCS   Delivered By:   [name], CNM   Delivery Type:   vaginal birth after cesarean (VBAC)  Delivery Details:   SROM for clear at 2145, arrived in early labor. She received a labor epidural. Labor progressed linearly. She was found to be complete and 0 station at 0541. She rested for one hour and then began pushing. FSE placed at beginning  of pushing for decels. Using various positions, she made consistent progress with each contraction. While tilted to her right, she a VBAC at 53. Infant birthed ROA with a compound hand followed by easy shoulders. Infant placed on mother's abdomen. Vigorous cry, HR >100, quick to pink up. Cord doubly clamped and cut by dad once pulsation ceased. Placenta delivered with some maternal effort. On inspection of perineum a third degree laceration was found, Dr Logan Bores was called in the the repair-see note. Infant skin to skin with mother, both stable.  Anesthesia:    epidural Intrapartum complications: None GBS:    Negative Laceration:    3rd degree Episiotomy:    none Rectal exam:   Intact  Placenta:    Delivered and expressed via active management. Intact: yes. To pathology: no.  Delayed Cord Clamping: yes Estimated Blood Loss:       Baby:    Liveborn female, APGARs 8/9, weight weight pending    Renee Hart  [facility]  12/26/2023 8:41 AM

## 2023-10-18 ENCOUNTER — Encounter: Payer: Self-pay | Admitting: Dermatology

## 2023-10-19 ENCOUNTER — Encounter: Payer: Commercial Managed Care - PPO | Admitting: Internal Medicine

## 2023-10-19 ENCOUNTER — Ambulatory Visit (INDEPENDENT_AMBULATORY_CARE_PROVIDER_SITE_OTHER): Payer: 59 | Admitting: Obstetrics

## 2023-10-19 ENCOUNTER — Other Ambulatory Visit (HOSPITAL_COMMUNITY)
Admission: RE | Admit: 2023-10-19 | Discharge: 2023-10-19 | Disposition: A | Discharge status: Home / Self Care | Payer: 59 | Source: Ambulatory Visit | Attending: Obstetrics | Admitting: Obstetrics

## 2023-10-19 ENCOUNTER — Encounter: Payer: Self-pay | Admitting: Obstetrics

## 2023-10-19 VITALS — BP 111/68 | HR 89 | Wt 155.9 lb

## 2023-10-19 DIAGNOSIS — N898 Other specified noninflammatory disorders of vagina: Secondary | ICD-10-CM | POA: Insufficient documentation

## 2023-10-19 DIAGNOSIS — Z3480 Encounter for supervision of other normal pregnancy, unspecified trimester: Secondary | ICD-10-CM | POA: Diagnosis not present

## 2023-10-19 DIAGNOSIS — Z3A30 30 weeks gestation of pregnancy: Secondary | ICD-10-CM

## 2023-10-19 DIAGNOSIS — Z3483 Encounter for supervision of other normal pregnancy, third trimester: Secondary | ICD-10-CM

## 2023-10-19 NOTE — Progress Notes (Signed)
 Routine Prenatal Care Visit  Subjective  Renee Hart is a 33 y.o. G2P1001 at [redacted]w[redacted]d being seen today for ongoing prenatal care.  She is currently monitored for the following issues for this low-risk pregnancy and has Old bucket handle tear of lateral meniscus of right knee; Anxiety disorder; Supervision of other normal pregnancy, antepartum; History of macrosomia in infant in prior pregnancy, currently pregnant; and History of cesarean delivery affecting pregnancy on their problem list.  -----------------------------------------------She is feeling well. Would like TOLAC. Has named hr son Renee Hart. Contractions: Not present. Vag. Bleeding: None.  Movement: Present. Leaking Fluid denies.  ----------------------------------------------------------------------------------- The following portions of the patient's history were reviewed and updated as appropriate: allergies, current medications, past family history, past medical history, past social history, past surgical history and problem list. Problem list updated.  Objective  Blood pressure 111/68, pulse 89, weight 155 lb 14.4 oz (70.7 kg), last menstrual period 03/23/2023, currently breastfeeding. Pregravid weight 128 lb (58.1 kg) Total Weight Gain 27 lb 14.4 oz (12.7 kg) Urinalysis: Urine Protein    Urine Glucose    Fetal Status:     Movement: Present     General:  Alert, oriented and cooperative. Patient is in no acute distress.  Skin: Skin is warm and dry. No rash noted.   Cardiovascular: Normal heart rate noted  Respiratory: Normal respiratory effort, no problems with respiration noted  Abdomen: Soft, gravid, appropriate for gestational age. Pain/Pressure: Absent     Pelvic:  Cervical exam deferred        Extremities: Normal range of motion.  Edema: None  Mental Status: Normal mood and affect. Normal behavior. Normal judgment and thought content.   Assessment   33 y.o. G2P1001 at [redacted]w[redacted]d by  12/28/2023, Date entered prior to episode  creation presenting for routine prenatal visit  Plan   second Problems (from 05/17/23 to present)     Problem Noted Diagnosed Resolved   Supervision of other normal pregnancy, antepartum 05/17/2023 by Vicci Levan, CMA  No   Overview Addendum 10/16/2023  1:27 PM by Kizzie Camelia CROME, CMA   Clinical Staff Provider  Office Location  [facility] Dating  NL and 9wkus  Language  English Anatomy US   No fetal or maternal abnormalities detected.   Flu Vaccine  07/23/23  Genetic Screen  NIPS: neg, xy  TDaP vaccine  10/03/2023 Hgb A1C or  GTT Early : Third trimester : 101  Covid No boosters   LAB RESULTS   Rhogam  O/Positive/-- (08/30 1114)  Blood Type O/Positive/-- (08/30 1114)   Feeding Plan breast Antibody Negative (08/30 1114)  Contraception pill Rubella 12.60 (08/30 1114)  Circumcision yes RPR Non Reactive (08/30 1114)   Pediatrician  [facility] [place] HBsAg Negative (08/30 1114)   Support Person Lilian HIV Non Reactive (08/30 1114)  Prenatal Classes Preg program via [facility] Varicella Immune    GBS  (For PCN allergy, check sensitivities)   BTL Consent  Hep C Non Reactive (08/30 1114)   VBAC Consent  Pap Diagnosis  Date Value Ref Range Status  12/28/2021   Final   - Negative for intraepithelial lesion or malignancy (NILM)      Hgb Electro      CF      SMA                    Preterm labor symptoms and general obstetric precautions including but not limited to vaginal bleeding, contractions, leaking of fluid and fetal movement were reviewed in  detail with the patient. Please refer to After Visit Summary for other counseling recommendations.  Talked about natural cervical ripeners to start at 35 weeks ( EPO, yoga positions, IC) I have sent in an aptima swab to test for yeast- will try OTC Monistat  No follow-ups on file.  Rollene CHRISTELLA Ro, CNM  10/19/2023 8:49 AM

## 2023-10-22 LAB — CERVICOVAGINAL ANCILLARY ONLY
Bacterial Vaginitis (gardnerella): NEGATIVE
Candida Glabrata: NEGATIVE
Candida Vaginitis: POSITIVE — AB
Comment: NEGATIVE
Comment: NEGATIVE
Comment: NEGATIVE

## 2023-10-29 ENCOUNTER — Encounter: Payer: Self-pay | Admitting: Obstetrics and Gynecology

## 2023-10-29 ENCOUNTER — Encounter: Payer: Self-pay | Admitting: Internal Medicine

## 2023-11-02 ENCOUNTER — Encounter: Payer: 59 | Admitting: Obstetrics

## 2023-11-02 ENCOUNTER — Ambulatory Visit (INDEPENDENT_AMBULATORY_CARE_PROVIDER_SITE_OTHER): Payer: 59

## 2023-11-02 VITALS — BP 109/76 | HR 89 | Wt 157.6 lb

## 2023-11-02 DIAGNOSIS — O09299 Supervision of pregnancy with other poor reproductive or obstetric history, unspecified trimester: Secondary | ICD-10-CM

## 2023-11-02 DIAGNOSIS — Z3480 Encounter for supervision of other normal pregnancy, unspecified trimester: Secondary | ICD-10-CM

## 2023-11-02 DIAGNOSIS — Z23 Encounter for immunization: Secondary | ICD-10-CM | POA: Diagnosis not present

## 2023-11-02 DIAGNOSIS — O34219 Maternal care for unspecified type scar from previous cesarean delivery: Secondary | ICD-10-CM

## 2023-11-02 DIAGNOSIS — Z2911 Encounter for prophylactic immunotherapy for respiratory syncytial virus (RSV): Secondary | ICD-10-CM

## 2023-11-02 DIAGNOSIS — Z3A32 32 weeks gestation of pregnancy: Secondary | ICD-10-CM

## 2023-11-02 DIAGNOSIS — O09293 Supervision of pregnancy with other poor reproductive or obstetric history, third trimester: Secondary | ICD-10-CM

## 2023-11-02 NOTE — Assessment & Plan Note (Signed)
-   Growth ultrasound ordered for 35 weeks.

## 2023-11-02 NOTE — Progress Notes (Signed)
    Return Prenatal Note   Assessment/Plan   Plan  33 y.o. G2P1001 at [redacted]w[redacted]d presents for follow-up OB visit. Reviewed prenatal record including previous visit note.  History of macrosomia in infant in prior pregnancy, currently pregnant - Growth ultrasound ordered for 35 weeks.   Supervision of other normal pregnancy, antepartum - RSV vaccine given today. - Reviewed kick counts and preterm labor warning signs. Instructed to call office or come to hospital with persistent headache, vision changes, regular contractions, leaking of fluid, decreased fetal movement or vaginal bleeding.   Orders Placed This Encounter  Procedures   US OB Comp + 14 Wk    Standing Status:   Future    Expected Date:   11/23/2023    Expiration Date:   01/31/2024    Reason for Exam (SYMPTOM  OR DIAGNOSIS REQUIRED):   Fetal Anatomic Survey    Preferred Imaging Location?:   Internal   Return in about 2 weeks (around 11/16/2023) for ROB.   Future Appointments  Date Time Provider Department Center  11/02/2023  3:35 PM Bowyn Mercier, Lindalou Hose, CNM [facility] None  02/11/2024  8:30 AM Willeen Niece, MD [facility] None  05/26/2024  8:20 AM Reubin Milan, MD [facility]    For next visit:  continue with routine prenatal care     Subjective   33 y.o. G2P1001 at [redacted]w[redacted]d presents for this follow-up prenatal visit.  Patient is feeling well, no concerns.  Patient reports: Movement: Present  Objective   Flow sheet Vitals: Pulse Rate: 89 BP: 109/76 Fundal Height: 33 cm Fetal Heart Rate (bpm): 155 Presentation: Vertex Total weight gain: 29 lb 9.6 oz (13.4 kg)  General Appearance  No acute distress, well appearing, and well nourished Pulmonary   Normal work of breathing Neurologic   Alert and oriented to person, place, and time Psychiatric   Mood and affect within normal limits  Lindalou Hose Quaniyah Bugh, CNM  01/17/253:27 PM

## 2023-11-02 NOTE — Assessment & Plan Note (Addendum)
-   RSV vaccine given today. - Reviewed kick counts and preterm labor warning signs. Instructed to call office or come to hospital with persistent headache, vision changes, regular contractions, leaking of fluid, decreased fetal movement or vaginal bleeding.

## 2023-11-02 NOTE — Progress Notes (Signed)
RSV vaccine administered today

## 2023-11-06 ENCOUNTER — Ambulatory Visit (INDEPENDENT_AMBULATORY_CARE_PROVIDER_SITE_OTHER): Payer: 59 | Admitting: Dermatology

## 2023-11-06 DIAGNOSIS — Z86018 Personal history of other benign neoplasm: Secondary | ICD-10-CM | POA: Diagnosis not present

## 2023-11-06 DIAGNOSIS — L905 Scar conditions and fibrosis of skin: Secondary | ICD-10-CM | POA: Diagnosis not present

## 2023-11-06 NOTE — Progress Notes (Signed)
   Follow-Up Visit   Subjective  Renee Hart is a 33 y.o. female who presents for the following: 2 months f/u R super pubic vulva- Dysplastic nevus- moderate atypia removed 09/03/2023, patient report area painful and sometimes will swell up.   Patient 7 months pregnant  The patient has spots, moles and lesions to be evaluated, some may be new or changing and the patient may have concern these could be cancer.   The following portions of the chart were reviewed this encounter and updated as appropriate: medications, allergies, medical history  Review of Systems:  No other skin or systemic complaints except as noted in HPI or Assessment and Plan.  Objective  Well appearing patient in no apparent distress; mood and affect are within normal limits.   A focused examination was performed of the following areas:face,groin   Relevant exam findings are noted in the Assessment and Plan.    Assessment & Plan   SCAR Exam: pink smooth macule at the right super pubic vulva bx site, well healed, no evidence of infection or hypertrophic scar at this time.  Benign-appearing.  Observation.  Call clinic for new or changing lesions. Treatment: Recommend Serica moisturizing scar formula cream every night or [employer] brand or Mederma silicone scar sheet every night for the first year after a scar appears to help with scar remodeling if desired. Scars remodel on their own for a full year and will gradually improve in appearance over time.   Consult with OB/GYN for swelling in the groin not related to the scar, probably related to pregnancy- pt is due in March.  HISTORY OF DYSPLASTIC NEVUS right super pubic vulva No evidence of recurrence today Recommend regular full body skin exams Recommend daily broad spectrum sunscreen SPF 30+ to sun-exposed areas, reapply every 2 hours as needed.  Call if any new or changing lesions are noted between office visits      Return for scheduled appt   February 11, 2024.  I, Angelique Holm, CMA, am acting as scribe for Willeen Niece, MD .   Documentation: I have reviewed the above documentation for accuracy and completeness, and I agree with the above.  Willeen Niece, MD

## 2023-11-06 NOTE — Patient Instructions (Signed)

## 2023-11-16 ENCOUNTER — Other Ambulatory Visit (HOSPITAL_COMMUNITY)
Admission: RE | Admit: 2023-11-16 | Discharge: 2023-11-16 | Disposition: A | Discharge status: Home / Self Care | Payer: 59 | Source: Ambulatory Visit

## 2023-11-16 ENCOUNTER — Ambulatory Visit (INDEPENDENT_AMBULATORY_CARE_PROVIDER_SITE_OTHER): Payer: 59

## 2023-11-16 VITALS — BP 116/77 | HR 99 | Wt 159.0 lb

## 2023-11-16 DIAGNOSIS — Z3480 Encounter for supervision of other normal pregnancy, unspecified trimester: Secondary | ICD-10-CM | POA: Diagnosis not present

## 2023-11-16 DIAGNOSIS — N898 Other specified noninflammatory disorders of vagina: Secondary | ICD-10-CM

## 2023-11-16 DIAGNOSIS — F419 Anxiety disorder, unspecified: Secondary | ICD-10-CM | POA: Insufficient documentation

## 2023-11-16 DIAGNOSIS — D649 Anemia, unspecified: Secondary | ICD-10-CM | POA: Insufficient documentation

## 2023-11-16 DIAGNOSIS — F5089 Other specified eating disorder: Secondary | ICD-10-CM | POA: Insufficient documentation

## 2023-11-16 DIAGNOSIS — O99019 Anemia complicating pregnancy, unspecified trimester: Secondary | ICD-10-CM

## 2023-11-16 DIAGNOSIS — O99013 Anemia complicating pregnancy, third trimester: Secondary | ICD-10-CM

## 2023-11-16 DIAGNOSIS — O09299 Supervision of pregnancy with other poor reproductive or obstetric history, unspecified trimester: Secondary | ICD-10-CM | POA: Diagnosis not present

## 2023-11-16 DIAGNOSIS — O09893 Supervision of other high risk pregnancies, third trimester: Secondary | ICD-10-CM

## 2023-11-16 DIAGNOSIS — O34219 Maternal care for unspecified type scar from previous cesarean delivery: Secondary | ICD-10-CM

## 2023-11-16 DIAGNOSIS — Z3A34 34 weeks gestation of pregnancy: Secondary | ICD-10-CM

## 2023-11-16 DIAGNOSIS — O09293 Supervision of pregnancy with other poor reproductive or obstetric history, third trimester: Secondary | ICD-10-CM

## 2023-11-16 HISTORY — DX: Anemia complicating pregnancy, unspecified trimester: O99.019

## 2023-11-16 NOTE — Progress Notes (Signed)
    Return Prenatal Note   Assessment/Plan   Plan  33 y.o. G2P1001 at [redacted]w[redacted]d presents for follow-up OB visit. Reviewed prenatal record including previous visit note.  History of macrosomia in infant in prior pregnancy, currently pregnant - Has growth ultrasound scheduled for 2/7. Given history of macrosomic infant, plans to make decision about delivery planning after this ultrasound.   Supervision of other normal pregnancy, antepartum - Prepared for GBS and GC/CT screening at next visit. - Has used Monistat 3 on and off for discharge she has experienced since being diagnosed with yeast infection. Recommended confirming yeast with vaginal swab today and then using full course of Monistat 7 if confirmed.  - Reviewed kick counts and preterm labor warning signs. Instructed to call office or come to hospital with persistent headache, vision changes, regular contractions, leaking of fluid, decreased fetal movement or vaginal bleeding.  Pica - Has started chewing on ice very regularly. Anemic in previous pregnancy, CBC collected today.   Orders Placed This Encounter  Procedures   CBC   Return in about 2 weeks (around 11/30/2023) for ROB with MD.   Future Appointments  Date Time Provider Department Center  11/23/2023  8:15 AM [facility] [place] [facility] None  02/11/2024  8:30 AM Willeen Niece, MD [facility] None  05/26/2024  8:20 AM Reubin Milan, MD [facility]    For next visit:  continue with routine prenatal care     Subjective   33 y.o. G2P1001 at [redacted]w[redacted]d presents for this follow-up prenatal visit.  Patient has been experiencing increased vaginal discharge and has also been chewing ice more regularly. Patient reports: Movement: Present Contractions: Not present  Objective   Flow sheet Vitals: Pulse Rate: 99 BP: 116/77 Fundal Height: 34 cm Fetal Heart Rate (bpm): 150 Total weight gain: 31 lb (14.1 kg)  General Appearance  No acute distress, well appearing, and well  nourished Pulmonary   Normal work of breathing Neurologic   Alert and oriented to person, place, and time Psychiatric   Mood and affect within normal limits  Burney Gauze, CNM  11/16/23

## 2023-11-16 NOTE — Assessment & Plan Note (Signed)
-   Has growth ultrasound scheduled for 2/7. Given history of macrosomic infant, plans to make decision about delivery planning after this ultrasound.

## 2023-11-16 NOTE — Assessment & Plan Note (Signed)
-   Has started chewing on ice very regularly. Anemic in previous pregnancy, CBC collected today.

## 2023-11-16 NOTE — Assessment & Plan Note (Addendum)
-   Prepared for GBS and GC/CT screening at next visit. - Has used Monistat 3 on and off for discharge she has experienced since being diagnosed with yeast infection. Recommended confirming yeast with vaginal swab today and then using full course of Monistat 7 if confirmed.  - Reviewed kick counts and preterm labor warning signs. Instructed to call office or come to hospital with persistent headache, vision changes, regular contractions, leaking of fluid, decreased fetal movement or vaginal bleeding.

## 2023-11-17 LAB — CBC
Hematocrit: 31.8 % — ABNORMAL LOW (ref 34.0–46.6)
Hemoglobin: 10.4 g/dL — ABNORMAL LOW (ref 11.1–15.9)
MCH: 30.2 pg (ref 26.6–33.0)
MCHC: 32.7 g/dL (ref 31.5–35.7)
MCV: 92 fL (ref 79–97)
Platelets: 184 10*3/uL (ref 150–450)
RBC: 3.44 x10E6/uL — ABNORMAL LOW (ref 3.77–5.28)
RDW: 11.8 % (ref 11.7–15.4)
WBC: 8.8 10*3/uL (ref 3.4–10.8)

## 2023-11-19 ENCOUNTER — Encounter: Payer: Self-pay | Admitting: Pharmacist

## 2023-11-19 ENCOUNTER — Other Ambulatory Visit: Payer: Self-pay

## 2023-11-19 LAB — CERVICOVAGINAL ANCILLARY ONLY
Bacterial Vaginitis (gardnerella): NEGATIVE
Candida Glabrata: NEGATIVE
Candida Vaginitis: POSITIVE — AB
Chlamydia: NEGATIVE
Comment: NEGATIVE
Comment: NEGATIVE
Comment: NEGATIVE
Comment: NEGATIVE
Comment: NEGATIVE
Comment: NORMAL
Neisseria Gonorrhea: NEGATIVE
Trichomonas: NEGATIVE

## 2023-11-19 MED ORDER — ACCRUFER 30 MG PO CAPS
1.0000 | ORAL_CAPSULE | Freq: Two times a day (BID) | ORAL | 6 refills | Status: DC
  Filled 2023-11-19: qty 60, 30d supply, fill #0

## 2023-11-19 NOTE — Addendum Note (Signed)
Addended by: Autumn Messing on: 11/19/2023 09:49 AM   Modules accepted: Orders

## 2023-11-23 ENCOUNTER — Ambulatory Visit (INDEPENDENT_AMBULATORY_CARE_PROVIDER_SITE_OTHER): Payer: 59

## 2023-11-23 ENCOUNTER — Other Ambulatory Visit: Payer: Self-pay

## 2023-11-23 DIAGNOSIS — O09299 Supervision of pregnancy with other poor reproductive or obstetric history, unspecified trimester: Secondary | ICD-10-CM

## 2023-11-23 DIAGNOSIS — O09293 Supervision of pregnancy with other poor reproductive or obstetric history, third trimester: Secondary | ICD-10-CM

## 2023-11-23 DIAGNOSIS — Z3A32 32 weeks gestation of pregnancy: Secondary | ICD-10-CM

## 2023-11-23 DIAGNOSIS — Z3480 Encounter for supervision of other normal pregnancy, unspecified trimester: Secondary | ICD-10-CM

## 2023-11-23 DIAGNOSIS — Z2911 Encounter for prophylactic immunotherapy for respiratory syncytial virus (RSV): Secondary | ICD-10-CM

## 2023-11-23 DIAGNOSIS — O34219 Maternal care for unspecified type scar from previous cesarean delivery: Secondary | ICD-10-CM

## 2023-11-23 DIAGNOSIS — Z3A35 35 weeks gestation of pregnancy: Secondary | ICD-10-CM | POA: Diagnosis not present

## 2023-11-30 ENCOUNTER — Ambulatory Visit: Payer: 59 | Admitting: Obstetrics and Gynecology

## 2023-11-30 VITALS — BP 113/75 | HR 90 | Wt 165.8 lb

## 2023-11-30 DIAGNOSIS — Z3685 Encounter for antenatal screening for Streptococcus B: Secondary | ICD-10-CM | POA: Diagnosis not present

## 2023-11-30 DIAGNOSIS — O34219 Maternal care for unspecified type scar from previous cesarean delivery: Secondary | ICD-10-CM

## 2023-11-30 DIAGNOSIS — Z3A36 36 weeks gestation of pregnancy: Secondary | ICD-10-CM

## 2023-11-30 DIAGNOSIS — O09293 Supervision of pregnancy with other poor reproductive or obstetric history, third trimester: Secondary | ICD-10-CM | POA: Diagnosis not present

## 2023-11-30 DIAGNOSIS — Z3480 Encounter for supervision of other normal pregnancy, unspecified trimester: Secondary | ICD-10-CM | POA: Diagnosis not present

## 2023-11-30 DIAGNOSIS — Z113 Encounter for screening for infections with a predominantly sexual mode of transmission: Secondary | ICD-10-CM

## 2023-11-30 DIAGNOSIS — O09299 Supervision of pregnancy with other poor reproductive or obstetric history, unspecified trimester: Secondary | ICD-10-CM

## 2023-11-30 LAB — POCT URINALYSIS DIPSTICK OB
Bilirubin, UA: NEGATIVE
Blood, UA: NEGATIVE
Glucose, UA: NEGATIVE
Ketones, UA: NEGATIVE
Leukocytes, UA: NEGATIVE
Nitrite, UA: NEGATIVE
Spec Grav, UA: 1.025 (ref 1.010–1.025)
Urobilinogen, UA: 0.2 U/dL
pH, UA: 6 (ref 5.0–8.0)

## 2023-11-30 NOTE — Progress Notes (Signed)
ROB: Patient is a 33 y.o. G2P1001 at [redacted]w[redacted]d who presents for routine OB care.  Pregnancy is complicated by  has Old bucket handle tear of lateral meniscus of right knee; Anxiety disorder; Supervision of other normal pregnancy, antepartum; History of macrosomia in infant in prior pregnancy, currently pregnant; History of cesarean delivery affecting pregnancy; and Anemia affecting pregnancy.   Patient denies complaints today.  Also presents for discussion of TOLAC. Has h/o prior C-section x 1, for arrest of descent, fetal malpositioning (occiput transverse) and fetal macrosomia (9 lb infant).  Had growth scan performed last week, current fetus in 52%ile (5 lb 12 oz).  Advised based on fetal size alone, patient has a much better chance of successful TOLAC.  Discussed risks vs benefits of TOLAC vs repeat C-section.  Risk of uterine rupture at term is ~ 1%.  Risk of failed trial of labor after cesarean (TOLAC) without a vaginal birth after cesarean (VBAC) resulting in repeat cesarean delivery (RCD) in about 20 to 40 percent of women who attempt VBAC.  Risk of requiring emergency C-section due to uterine rupture discussed. The benefits of a trial of labor after cesarean (TOLAC) resulting in a vaginal birth after cesarean (VBAC) include the following: shorter length of hospital stay and postpartum recovery (in most cases); fewer complications, such as postpartum fever, wound or uterine infection, thromboembolism (blood clots in the leg or lung), need for blood transfusion and fewer neonatal breathing problems. VBAC calculated score is 52.6%. Patient notes understanding.  Still desires TOLAC.  Patient ok to proceed. Consent form signed today.    36 week cultures performed (GBS only as patient recently had GC/CT screening ~ 2 weeks ago, can repeat again in 2 weeks). Discussed natural cervical ripening agents.  RTC in 1 week.

## 2023-11-30 NOTE — Progress Notes (Signed)
ROB [redacted]w[redacted]d: She is doing well. She reports good fetal movement and has no new concerns today. GBS was done today. Need to repeat G/C @ 38 weeks since it was done on 11/16/23.

## 2023-11-30 NOTE — Patient Instructions (Signed)
Natural methods to encourage timely labor   Your EDD (estimated due date) is just that... an estimate.  Your baby may come earlier or later, there are a lot of hormones and feedback loops to cause labor to occur and to be honest, no one actually knows the EXACT reason why this occurs.     Full term is birth after 37 weeks, while post-term is birth after 41 weeks.  We will guide you in this process of determining labor and delivery planning, but we hope that you will go into spontaneous labor between these 4 weeks.  Review the information in your Midwifery handout for our induction of labor detail, statistics, and processes as when your pregnancy ages, so does your placenta and this can lead to poorer placental.    The best natural method to consider when encouraging labor is time and patience.  This information below is to discuss ways that your body can prepare and be ready for labor and encourage this to happen on its own without a medical induction at the hospital.  Many methods do not have a ton of evidence based research behind them, but do come with minimal risk and potential good benefits  of helping your body prepare for labor. The information on herbs has been gathered over hundreds of years and has little scientific research, but has been tested by time and experience.       At 36 weeks: EVENING PRIMROSE OIL: -- These are available in capsules (500-1000mg ) at whole foods, [employer], [place], etc.  -- May take 1-3 capsules a day.  The capsules can be taken orally and swallowed, or inserted vaginally at night.  They will leak out overnight, so wear a pad or a liner to avoid oil on your sheets.  -- May take one capsule orally and insert one vaginally at night until delivery.  May increase to 2 vaginally at night from 38 weeks to delivery.  -- These contain GLA (an Omega-6 fatty acid) and are a precursor to labor hormones and have been shown to get the body ready for labor.  They may soften  the cervix, but will not cause you to be induced, so take without worry.   -- If you have had a vaginal delivery in the past, likely your cervix is already ripe and you do not need to take these. Discuss with your midwife.    DATES: -- Eat 6 dates a day from 36 weeks until delivery. If you have gestational diabetes, please discuss this with [place], as you should probably not use this with the high sugar content in dates.   -- One study showed a reduced need for inducing and augmenting labor and a more favorable delivery outcome, but this was not statistically significant in the study.    RED RASPBERRY LEAF TEA -- Drink 1-2 cups a day until labor begins to help tone the uterus and organize contractions -- Make a strong tea using 1/4 oz dried herb to 1 pint of water and steep for 20 minutes (or use some that is commercially available).  -- Try to do this early in the day as contractions overnight can disrupt a good nights sleep!   ACUPUNCTURE -- can be done weekly starting at 36 weeks by a trained acupuncturist.  There are many in the area that perform this using fine needles at certain points on the body to help ripen the cervix and induce labor.  -- if this is performed close to the  due date, it has been shown to have a success rate of up to 88% in starting labor.    At 37 weeks:  EVENING PRIMROSE OIL --Take 1000 mg  twice daily - by mouth in the morning and vaginally at bedtime. (If previous C-section, take both doses by mouth)   At 38-39 weeks:  BLUE COHOSH 1 capsule daily starting at [redacted] weeks gestation 2 capsules daily starting at [redacted] weeks gestation 3 capsules daily starting at [redacted] weeks gestation   OTHER SUGGESTIONS: SPINNING BABIES and the MILES CIRCUIT (to encourage optimal positioning)  www.spinningbabies.com & https://glass.com/.com    BELLY BINDING --Poor abdominal support can lead to malpositioned babies and therefore prodromal labor.  Support good abdominal tone and relieve back  pain by wearing a tummy support whether with a belly binder, using a sheet to wrap around your hips, or a rebozo.  It will lift up your baby and place them more centrally in your pelvis to encourage pressure on your cervix.     EXERCISE -- Walking, belly dancing, swimming, bouncing on the birthing ball and some yoga movements can help the baby to descend into the birth canal and apply pressure on the cervix. Talk to [place] about which position may be most effective for you and your babys current position.     FOODS -- pineapple: rich in bromelain, an enzyme that can encourage the ripening of the cervix and simulate prostaglandin production             * eat this fresh daily, as canning and juicing destroys the bromelain -- black licorice candy (real): also known to stimulate prostaglandin production due to glycyrrhizin but can also cause mild diarrhea that may lead to sympthatetic uterine contractions, not much research here on this.    HERBS -- Basil, oregano, and thyme can help to coordinate your contractions if they are irregular and help to soften your cervix.  Make an eggplant parmesan or similar that uses these herbs or make a tea with one teaspoon of each and steep for 5 minutes.  Strain the leaves, add honey to sweeten it and sip the tea to welcome labor.    MASSAGE -- Prenatal massage increases blood circulation which can increase the circulation to the placenta and therefore the baby.  It also stimulates the lymph system to increase immunity and release toxins.  Massage can also increase endorphins into the mothers body to prepare her for an easier delivery with the sedating effects on the nervous system and promoting stress relief and relaxation.    RELAXATION/VISUALIZATION --The psyche is one of the 5 P's of labor (others are the passenger, passage, powers, and placenta). Fear and anxiety can stimmulate catecholamine release which can counteract the flow of hormones necessary in promoting  labor.  -- Choose your birth team and environment carefully so that you can feel relaxed and comfortable in the place of labor and birth.  Everyone should believe in you and your power to birth your baby.   -- Some choose to bring lights, battery candles, birth cards (that have photos or phrases of encouragement), essential oil diffusers, etc. to the labor room to help them stay relaxed in a new environment.

## 2023-12-02 LAB — STREP GP B NAA: Strep Gp B NAA: NEGATIVE

## 2023-12-14 ENCOUNTER — Other Ambulatory Visit (HOSPITAL_COMMUNITY)
Admission: RE | Admit: 2023-12-14 | Discharge: 2023-12-14 | Disposition: A | Discharge status: Home / Self Care | Source: Ambulatory Visit

## 2023-12-14 ENCOUNTER — Ambulatory Visit (INDEPENDENT_AMBULATORY_CARE_PROVIDER_SITE_OTHER): Payer: 59

## 2023-12-14 VITALS — Wt 169.0 lb

## 2023-12-14 DIAGNOSIS — O99019 Anemia complicating pregnancy, unspecified trimester: Secondary | ICD-10-CM

## 2023-12-14 DIAGNOSIS — Z3480 Encounter for supervision of other normal pregnancy, unspecified trimester: Secondary | ICD-10-CM

## 2023-12-14 DIAGNOSIS — Z3A38 38 weeks gestation of pregnancy: Secondary | ICD-10-CM

## 2023-12-14 DIAGNOSIS — Z113 Encounter for screening for infections with a predominantly sexual mode of transmission: Secondary | ICD-10-CM | POA: Diagnosis present

## 2023-12-14 DIAGNOSIS — O34219 Maternal care for unspecified type scar from previous cesarean delivery: Secondary | ICD-10-CM | POA: Diagnosis not present

## 2023-12-14 DIAGNOSIS — F419 Anxiety disorder, unspecified: Secondary | ICD-10-CM

## 2023-12-14 NOTE — Progress Notes (Signed)
    Return Prenatal Note   Assessment/Plan   Plan  33 y.o. G2P1001 at [redacted]w[redacted]d presents for follow-up OB visit. Reviewed prenatal record including previous visit note.  Supervision of other normal pregnancy, antepartum - Reviewed negative GBS results. Aptima swab collected today since she had previously had a yeast infection and wanted confirmation it had completely cleared. Not currently having symptoms. - Ready for baby, doing Spinning Babies exercises, EPO, red raspberry leaf tea, and dates. Offered info on [employer].  - Reviewed labor warning signs and expectations for birth. Instructed to call office or come to hospital with persistent headache, vision changes, regular contractions, leaking of fluid, decreased fetal movement or vaginal bleeding.  Desires VBAC (vaginal birth after cesarean) trial - Continues to desire VBAC after counseling with Dr. Valentino Saxon at last visit.    No orders of the defined types were placed in this encounter.  Return in about 1 week (around 12/21/2023) for ROB.   Future Appointments  Date Time Provider Department Center  12/21/2023  8:35 AM Hildred Laser, MD [facility] None  02/11/2024  8:30 AM Willeen Niece, MD [facility] None  05/26/2024  8:20 AM Reubin Milan, MD [facility]    For next visit:  continue with routine prenatal care     Subjective   33 y.o. G2P1001 at [redacted]w[redacted]d presents for this follow-up prenatal visit.  Patient has been working on Dietitian for labor. Wants to know if there is anything else she can do. Patient reports: Movement: Present Contractions: Not present  Objective   Flow sheet Vitals: Fundal Height: 37 cm Fetal Heart Rate (bpm): 155 Presentation: Vertex Total weight gain: 41 lb (18.6 kg)  General Appearance  No acute distress, well appearing, and well nourished Pulmonary   Normal work of breathing Neurologic   Alert and oriented to person, place, and time Psychiatric   Mood and affect within normal limits  Lindalou Hose  Bebe Moncure, CNM  02/28/258:31 AM

## 2023-12-14 NOTE — Assessment & Plan Note (Addendum)
-   Reviewed negative GBS results. Aptima swab collected today since she had previously had a yeast infection and wanted confirmation it had completely cleared. Not currently having symptoms. - Ready for baby, doing Spinning Babies exercises, EPO, red raspberry leaf tea, and dates. Offered info on [employer].  - Reviewed labor warning signs and expectations for birth. Instructed to call office or come to hospital with persistent headache, vision changes, regular contractions, leaking of fluid, decreased fetal movement or vaginal bleeding.

## 2023-12-14 NOTE — Assessment & Plan Note (Signed)
-   Continues to desire VBAC after counseling with Dr. Valentino Saxon at last visit.

## 2023-12-16 LAB — CERVICOVAGINAL ANCILLARY ONLY
Bacterial Vaginitis (gardnerella): NEGATIVE
Candida Glabrata: NEGATIVE
Candida Vaginitis: NEGATIVE
Chlamydia: NEGATIVE
Comment: NEGATIVE
Comment: NEGATIVE
Comment: NEGATIVE
Comment: NEGATIVE
Comment: NEGATIVE
Comment: NORMAL
Neisseria Gonorrhea: NEGATIVE
Trichomonas: NEGATIVE

## 2023-12-21 ENCOUNTER — Encounter: Payer: Self-pay | Admitting: Obstetrics and Gynecology

## 2023-12-21 ENCOUNTER — Ambulatory Visit: Payer: 59 | Admitting: Obstetrics and Gynecology

## 2023-12-21 VITALS — BP 112/79 | HR 84 | Wt 172.2 lb

## 2023-12-21 DIAGNOSIS — Z3480 Encounter for supervision of other normal pregnancy, unspecified trimester: Secondary | ICD-10-CM

## 2023-12-21 DIAGNOSIS — Z3483 Encounter for supervision of other normal pregnancy, third trimester: Secondary | ICD-10-CM | POA: Diagnosis not present

## 2023-12-21 DIAGNOSIS — Z3A39 39 weeks gestation of pregnancy: Secondary | ICD-10-CM | POA: Diagnosis not present

## 2023-12-21 DIAGNOSIS — O480 Post-term pregnancy: Secondary | ICD-10-CM

## 2023-12-21 NOTE — Progress Notes (Signed)
 ROB [redacted]w[redacted]d: Patient reports good fetal movement with some pelvic pressure and irregular contractions. She desires a cervix check today.

## 2023-12-21 NOTE — Progress Notes (Signed)
 ROB: Patient is a 33 y.o. G2P1001 at [redacted]w[redacted]d who presents for routine OB care.  Pregnancy is complicated: has Old bucket handle tear of lateral meniscus of right knee; Anxiety disorder; Supervision of other normal pregnancy, antepartum; History of macrosomia in infant in prior pregnancy, currently pregnant; History of cesarean delivery affecting pregnancy; Anemia affecting pregnancy; and Desires VBAC (vaginal birth after cesarean) trial on their problem list..   Patient denies major complaints. Notes several days ago having some more regular contractions during the night but went away by the morning. Reviewed labor precautions. Currently using evening primrose oil and red raspberry leaf tea for natural cervical ripening. Also doing [employer]. Has questions about perineal massage, discussed pros and cons. RTC in 1 week. If no delivery by then, can schedule for postdates IOL, also can perform membrane sweeping. Will schedule for BPP postdates as well.

## 2023-12-25 ENCOUNTER — Inpatient Hospital Stay
Admission: EM | Admit: 2023-12-25 | Discharge: 2023-12-27 | DRG: 768 | Disposition: A | Discharge status: Home / Self Care | Attending: Licensed Practical Nurse | Admitting: Licensed Practical Nurse

## 2023-12-25 DIAGNOSIS — O9902 Anemia complicating childbirth: Secondary | ICD-10-CM | POA: Diagnosis present

## 2023-12-25 DIAGNOSIS — O326XX0 Maternal care for compound presentation, not applicable or unspecified: Secondary | ICD-10-CM | POA: Diagnosis present

## 2023-12-25 DIAGNOSIS — Z3A39 39 weeks gestation of pregnancy: Secondary | ICD-10-CM

## 2023-12-25 DIAGNOSIS — Z3480 Encounter for supervision of other normal pregnancy, unspecified trimester: Principal | ICD-10-CM

## 2023-12-25 DIAGNOSIS — O34211 Maternal care for low transverse scar from previous cesarean delivery: Principal | ICD-10-CM | POA: Diagnosis present

## 2023-12-25 DIAGNOSIS — O99013 Anemia complicating pregnancy, third trimester: Secondary | ICD-10-CM

## 2023-12-26 ENCOUNTER — Encounter: Payer: Self-pay | Admitting: Obstetrics and Gynecology

## 2023-12-26 ENCOUNTER — Inpatient Hospital Stay: Admitting: Anesthesiology

## 2023-12-26 ENCOUNTER — Other Ambulatory Visit: Payer: Self-pay

## 2023-12-26 DIAGNOSIS — Z3A39 39 weeks gestation of pregnancy: Secondary | ICD-10-CM | POA: Diagnosis not present

## 2023-12-26 DIAGNOSIS — O4202 Full-term premature rupture of membranes, onset of labor within 24 hours of rupture: Secondary | ICD-10-CM

## 2023-12-26 DIAGNOSIS — O26893 Other specified pregnancy related conditions, third trimester: Secondary | ICD-10-CM | POA: Diagnosis present

## 2023-12-26 DIAGNOSIS — O34211 Maternal care for low transverse scar from previous cesarean delivery: Secondary | ICD-10-CM | POA: Diagnosis not present

## 2023-12-26 DIAGNOSIS — Z3A00 Weeks of gestation of pregnancy not specified: Secondary | ICD-10-CM | POA: Diagnosis not present

## 2023-12-26 DIAGNOSIS — O9902 Anemia complicating childbirth: Secondary | ICD-10-CM | POA: Diagnosis not present

## 2023-12-26 DIAGNOSIS — O34219 Maternal care for unspecified type scar from previous cesarean delivery: Secondary | ICD-10-CM

## 2023-12-26 DIAGNOSIS — O326XX0 Maternal care for compound presentation, not applicable or unspecified: Secondary | ICD-10-CM | POA: Diagnosis not present

## 2023-12-26 LAB — RPR: RPR Ser Ql: NONREACTIVE

## 2023-12-26 LAB — CBC
HCT: 36.2 % (ref 36.0–46.0)
Hemoglobin: 12.2 g/dL (ref 12.0–15.0)
MCH: 31.2 pg (ref 26.0–34.0)
MCHC: 33.7 g/dL (ref 30.0–36.0)
MCV: 92.6 fL (ref 80.0–100.0)
Platelets: 172 10*3/uL (ref 150–400)
RBC: 3.91 MIL/uL (ref 3.87–5.11)
RDW: 14.8 % (ref 11.5–15.5)
WBC: 10.1 10*3/uL (ref 4.0–10.5)
nRBC: 0 % (ref 0.0–0.2)

## 2023-12-26 LAB — TYPE AND SCREEN
ABO/RH(D): O POS
Antibody Screen: NEGATIVE

## 2023-12-26 LAB — RUPTURE OF MEMBRANE (ROM)PLUS: Rom Plus: POSITIVE

## 2023-12-26 MED ORDER — SIMETHICONE 80 MG PO CHEW: 80.0000 mg | CHEWABLE_TABLET | ORAL | Status: DC | PRN

## 2023-12-26 MED ORDER — FENTANYL-BUPIVACAINE-NACL 0.5-0.125-0.9 MG/250ML-% EP SOLN
12.0000 mL/h | EPIDURAL | Status: DC | PRN
  Administered 2023-12-26: 12 mL/h via EPIDURAL

## 2023-12-26 MED ORDER — EPHEDRINE 5 MG/ML INJ: 10.0000 mg | INTRAVENOUS | Status: DC | PRN

## 2023-12-26 MED ORDER — COCONUT OIL OIL: 1.0000 | TOPICAL_OIL | Status: DC | PRN

## 2023-12-26 MED ORDER — PHENYLEPHRINE 80 MCG/ML (10ML) SYRINGE FOR IV PUSH (FOR BLOOD PRESSURE SUPPORT): 80.0000 ug | PREFILLED_SYRINGE | INTRAVENOUS | Status: DC | PRN

## 2023-12-26 MED ORDER — SOD CITRATE-CITRIC ACID 500-334 MG/5ML PO SOLN: 30.0000 mL | ORAL | Status: DC | PRN

## 2023-12-26 MED ORDER — MISOPROSTOL 200 MCG PO TABS
ORAL_TABLET | ORAL | Status: AC
  Filled 2023-12-26: qty 4

## 2023-12-26 MED ORDER — LIDOCAINE HCL (PF) 1 % IJ SOLN: 30.0000 mL | INTRAMUSCULAR | Status: DC | PRN

## 2023-12-26 MED ORDER — OXYTOCIN-SODIUM CHLORIDE 30-0.9 UT/500ML-% IV SOLN: 2.5000 [IU]/h | INTRAVENOUS | Status: DC | PRN

## 2023-12-26 MED ORDER — OXYTOCIN BOLUS FROM INFUSION
333.0000 mL | Freq: Once | INTRAVENOUS | Status: AC
  Administered 2023-12-26: 333 mL via INTRAVENOUS

## 2023-12-26 MED ORDER — WITCH HAZEL-GLYCERIN EX PADS: MEDICATED_PAD | CUTANEOUS | Status: DC | PRN

## 2023-12-26 MED ORDER — LACTATED RINGERS IV SOLN: INTRAVENOUS | Status: DC

## 2023-12-26 MED ORDER — AMMONIA AROMATIC IN INHA
RESPIRATORY_TRACT | Status: AC
  Filled 2023-12-26: qty 10

## 2023-12-26 MED ORDER — TERBUTALINE SULFATE 1 MG/ML IJ SOLN: 0.2500 mg | Freq: Once | INTRAMUSCULAR | Status: DC | PRN

## 2023-12-26 MED ORDER — OXYTOCIN 10 UNIT/ML IJ SOLN
INTRAMUSCULAR | Status: AC
  Filled 2023-12-26: qty 2

## 2023-12-26 MED ORDER — DIPHENHYDRAMINE HCL 25 MG PO CAPS: 25.0000 mg | ORAL_CAPSULE | Freq: Four times a day (QID) | ORAL | Status: DC | PRN

## 2023-12-26 MED ORDER — LIDOCAINE-EPINEPHRINE (PF) 1.5 %-1:200000 IJ SOLN
INTRAMUSCULAR | Status: DC | PRN
  Administered 2023-12-26: 3 mL via PERINEURAL

## 2023-12-26 MED ORDER — HYDROXYZINE HCL 25 MG PO TABS: 50.0000 mg | ORAL_TABLET | Freq: Four times a day (QID) | ORAL | Status: DC | PRN

## 2023-12-26 MED ORDER — DIPHENHYDRAMINE HCL 50 MG/ML IJ SOLN: 12.5000 mg | INTRAMUSCULAR | Status: DC | PRN

## 2023-12-26 MED ORDER — FENTANYL-BUPIVACAINE-NACL 0.5-0.125-0.9 MG/250ML-% EP SOLN
EPIDURAL | Status: AC
  Filled 2023-12-26: qty 250

## 2023-12-26 MED ORDER — CALCIUM CARBONATE ANTACID 500 MG PO CHEW
1.0000 | CHEWABLE_TABLET | Freq: Once | ORAL | Status: AC
  Administered 2023-12-26: 200 mg via ORAL
  Filled 2023-12-26: qty 1

## 2023-12-26 MED ORDER — DOCUSATE SODIUM 100 MG PO CAPS
100.0000 mg | ORAL_CAPSULE | Freq: Two times a day (BID) | ORAL | Status: DC
  Administered 2023-12-26 – 2023-12-27 (×2): 100 mg via ORAL
  Filled 2023-12-26 (×2): qty 1

## 2023-12-26 MED ORDER — MAGNESIUM HYDROXIDE 400 MG/5ML PO SUSP: 30.0000 mL | ORAL | Status: DC | PRN

## 2023-12-26 MED ORDER — ACETAMINOPHEN 325 MG PO TABS
650.0000 mg | ORAL_TABLET | ORAL | Status: DC | PRN
  Administered 2023-12-26 (×2): 650 mg via ORAL
  Filled 2023-12-26 (×3): qty 2

## 2023-12-26 MED ORDER — WITCH HAZEL-GLYCERIN EX PADS
MEDICATED_PAD | CUTANEOUS | Status: AC
Start: 2023-12-26 — End: 2023-12-26
  Filled 2023-12-26: qty 100

## 2023-12-26 MED ORDER — BUPIVACAINE HCL (PF) 0.25 % IJ SOLN
INTRAMUSCULAR | Status: DC | PRN
  Administered 2023-12-26 (×2): 5 mL via EPIDURAL

## 2023-12-26 MED ORDER — OXYTOCIN-SODIUM CHLORIDE 30-0.9 UT/500ML-% IV SOLN
2.5000 [IU]/h | INTRAVENOUS | Status: DC
  Administered 2023-12-26: 2.5 [IU]/h via INTRAVENOUS
  Filled 2023-12-26: qty 500

## 2023-12-26 MED ORDER — BENZOCAINE-MENTHOL 20-0.5 % EX AERO
1.0000 | INHALATION_SPRAY | CUTANEOUS | Status: DC | PRN
  Filled 2023-12-26: qty 56

## 2023-12-26 MED ORDER — LACTATED RINGERS IV SOLN
500.0000 mL | Freq: Once | INTRAVENOUS | Status: AC
  Administered 2023-12-26: 500 mL via INTRAVENOUS

## 2023-12-26 MED ORDER — LACTATED RINGERS IV SOLN
500.0000 mL | INTRAVENOUS | Status: DC | PRN
  Administered 2023-12-26: 500 mL via INTRAVENOUS

## 2023-12-26 MED ORDER — ONDANSETRON HCL 4 MG/2ML IJ SOLN: 4.0000 mg | Freq: Four times a day (QID) | INTRAMUSCULAR | Status: DC | PRN

## 2023-12-26 MED ORDER — LIDOCAINE HCL (PF) 1 % IJ SOLN
INTRAMUSCULAR | Status: DC | PRN
  Administered 2023-12-26: 3 mL

## 2023-12-26 MED ORDER — LIDOCAINE HCL (PF) 1 % IJ SOLN
INTRAMUSCULAR | Status: AC
  Filled 2023-12-26: qty 30

## 2023-12-26 MED ORDER — FENTANYL CITRATE (PF) 100 MCG/2ML IJ SOLN: 50.0000 ug | INTRAMUSCULAR | Status: DC | PRN

## 2023-12-26 MED ORDER — SENNOSIDES-DOCUSATE SODIUM 8.6-50 MG PO TABS
2.0000 | ORAL_TABLET | ORAL | Status: DC
  Administered 2023-12-26 – 2023-12-27 (×2): 2 via ORAL
  Filled 2023-12-26 (×2): qty 2

## 2023-12-26 MED ORDER — IBUPROFEN 600 MG PO TABS
600.0000 mg | ORAL_TABLET | Freq: Four times a day (QID) | ORAL | Status: DC
  Administered 2023-12-26 – 2023-12-27 (×4): 600 mg via ORAL
  Filled 2023-12-26 (×4): qty 1

## 2023-12-26 MED ORDER — PRENATAL MULTIVITAMIN CH
1.0000 | ORAL_TABLET | Freq: Every day | ORAL | Status: DC
Start: 2023-12-26 — End: 2023-12-27
  Administered 2023-12-26 – 2023-12-27 (×2): 1 via ORAL
  Filled 2023-12-26 (×2): qty 1

## 2023-12-26 MED ORDER — OXYTOCIN-SODIUM CHLORIDE 30-0.9 UT/500ML-% IV SOLN: 1.0000 m[IU]/min | INTRAVENOUS | Status: DC

## 2023-12-26 NOTE — Lactation Note (Addendum)
 This note was copied from a baby's chart. Lactation Consultation Note  Patient Name: Renee Hart HQION'G Date: 12/26/2023 Age:34 hours Reason for consult: Initial assessment;Term   Maternal Data Has patient been taught Hand Expression?: Yes Does the patient have breastfeeding experience prior to this delivery?: Yes How long did the patient breastfeed?: 6 months  Initial assessment for P2 pt. Baby at 5 hours of life. This was SVD & VBAC. Pt with hx of anxiety not treated by medication.     Baby was sleeping while STS with mom. Baby Renee's last feeding was nearly 20 minutes prior. MOB stated she has BF experience and fed her 64 month old for 6 months. MOB expressed her experience was complicated with mastitis and breast abscess and is when she ended breastfeeding. MOB states baby Renee has been feeding in laid back position but will attempt football hold at next feeding to see how he latches. MOB stated she may be interested in pumping tomorrow when she is less tired. MOB states she wants to allow baby to learn more at the breast and to continue to let him feed at the breast as much as he wants.      Feeding Mother's Current Feeding Choice: Breast Milk  No feeding observed. MOB stated baby's last feed was 10 minutes each breasts.    Interventions Interventions: Breast feeding basics reviewed;Skin to skin;Breast massage;Adjust position;Position options;Education  LC student and MOB discussed feeding frequency at a minimum of 8 times a day, taught hand expression, # wets and # stools at 24 hr of life, on demand feeding by cues, STS, tummy size, switching breast, infant behavior and cluster feeding at 24 hours of life. MOB was encouraged to continue to be patient with her feeding sessions and use feeding position that is most comfortable for her and baby. MOB had no additional questions and understood that she could call [facility] team for additional support as needed.     Consult  Status Consult Status: Follow-up Date: 12/27/23 Follow-up type: In-patient    Terance Hart 12/26/2023, 1:21 PM

## 2023-12-26 NOTE — OB Triage Note (Signed)
 Patient is a 33 yo, G2P1, at 39 weeks and 5 days. Patient presents with complaints of contractions every 4 minutes and leaking of fluid. Pt reports her water breaking at 2145 and her contractions starting at 2215. Patient denies any vaginal bleeding. Patient reports +FM. Monitors applied and assessing. Initial fetal heart tone is 160. L. Dominic CNM notified of patients arrival to unit. Plan to do a Rom+ and labor eval

## 2023-12-26 NOTE — Progress Notes (Addendum)
 Renee Hart is a 33 y.o. G2P1001 at [redacted]w[redacted]d by LMP admitted for rupture of membranes  Subjective: Comfortable with epidural, denies rectal pressure.   Objective: BP 104/72   Pulse 95   Temp 98 F (36.7 C) (Oral)   Resp 16   Ht 5\' 4"  (1.626 m)   Wt 78 kg   LMP 03/23/2023 (Exact Date)   SpO2 97%   BMI 29.52 kg/m  No intake/output data recorded. No intake/output data recorded.  FHT:  FHR: 135 bpm, variability: moderate,  accelerations:  Present,  decelerations:  Present variable  UC:   regular, every 1.5-3 minutes SVE:   Dilation: 10 Effacement (%): 100 Station: -2 Exam by:: L. Kalanie Fewell CNM  Labs: Lab Results  Component Value Date   WBC 10.1 12/26/2023   HGB 12.2 12/26/2023   HCT 36.2 12/26/2023   MCV 92.6 12/26/2023   PLT 172 12/26/2023    Assessment / Plan: Spontaneous labor, progressing normally  Labor: Progressing normally. Will rest x 1 hour and then begin to push.  Fetal Wellbeing:  Category II Pain Control:  Epidural I/D:   GBS negative, SROM at 2145 Anticipated MOD:   VBAC  Ellouise Newer [facility], CNM 12/26/2023, 5:45 AM

## 2023-12-26 NOTE — Anesthesia Preprocedure Evaluation (Signed)
 Anesthesia Evaluation  Patient identified by MRN, date of birth, ID band Patient awake    Reviewed: Allergy & Precautions, NPO status , Patient's Chart, lab work & pertinent test results  History of Anesthesia Complications Negative for: history of anesthetic complications  Airway Mallampati: II  TM Distance: >3 FB Neck ROM: full    Dental no notable dental hx.    Pulmonary neg pulmonary ROS   Pulmonary exam normal        Cardiovascular Exercise Tolerance: Good negative cardio ROS Normal cardiovascular exam     Neuro/Psych  PSYCHIATRIC DISORDERS Anxiety        GI/Hepatic negative GI ROS,,,  Endo/Other    Renal/GU   negative genitourinary   Musculoskeletal   Abdominal   Peds  Hematology  (+) Blood dyscrasia, anemia   Anesthesia Other Findings Past Medical History: 09/03/2023: Dysplastic nevus     Comment:  R super pubic vulva, moderate atypia No date: Medical history non-contributory  Past Surgical History: 05/07/2021: CESAREAN SECTION     Comment:  Procedure: CESAREAN SECTION;  Surgeon: Hildred Laser,               MD;  Location: [facility] ORS;  Service: Obstetrics;; No date: EYE SURGERY     Comment:  PRK 08/28/2018: KNEE ARTHROSCOPY WITH MEDIAL MENISECTOMY; Right     Comment:  Procedure: KNEE ARTHROSCOPY WITH MEDIAL OR LATERAL               MENISECTOMY;  Surgeon: Juanell Fairly, MD;  Location:               [facility] ORS;  Service: Orthopedics;  Laterality: Right; No date: REFRACTIVE SURGERY  BMI    Body Mass Index: 29.52 kg/m      Reproductive/Obstetrics (+) Pregnancy                              Anesthesia Physical Anesthesia Plan  ASA: 2  Anesthesia Plan: Epidural   Post-op Pain Management:    Induction:   PONV Risk Score and Plan:   Airway Management Planned: Natural Airway  Additional Equipment:   Intra-op Plan:   Post-operative Plan:   Informed Consent: I  have reviewed the patients History and Physical, chart, labs and discussed the procedure including the risks, benefits and alternatives for the proposed anesthesia with the patient or authorized representative who has indicated his/her understanding and acceptance.     Dental Advisory Given  Plan Discussed with: Anesthesiologist  Anesthesia Plan Comments: (Patient reports no bleeding problems and no anticoagulant use.   Patient consented for risks of anesthesia including but not limited to:  - adverse reactions to medications - risk of bleeding, infection and or nerve damage from epidural that could lead to paralysis - risk of headache or failed epidural - nerve damage due to positioning - that if epidural is used for C-section that there is a chance of epidural failure requiring spinal placement or conversion to GA - Damage to heart, brain, lungs, other parts of body or loss of life  Patient voiced understanding and assent.)         Anesthesia Quick Evaluation

## 2023-12-26 NOTE — Progress Notes (Signed)
 Patient ID: Renee Hart, female   DOB: 07-03-1991, 33 y.o.   MRN: [id number]  Called to labor and delivery after delivery of the baby and the placenta.  Suspected third-degree laceration. Patient had an adequately working epidural and seemed comfortable. Swelling of the labia was obvious and with examination was found to have a partial third-degree tear with a section of the anal sphincter intact.  Several figure-of-eight sutures were used to carefully reapproximate the anal sphincter and closed the fascia overlying the sphincter for additional strength.  After this repair a normal second-degree remained and this was repaired in the usual manner using Vicryl suture.  There was somewhat of a stellate appearance to some of the tearing but with attention to detail careful approximation was performed.  Hemostasis was excellent at the conclusion of the repair. Patient tolerated the repair well. All sponge and needle counts were correct.

## 2023-12-26 NOTE — Anesthesia Procedure Notes (Signed)
 Epidural Patient location during procedure: OB Start time: 12/26/2023 1:52 AM End time: 12/26/2023 1:54 AM  Staffing Anesthesiologist: Louie Boston, MD Performed: anesthesiologist   Preanesthetic Checklist Completed: patient identified, IV checked, site marked, risks and benefits discussed, surgical consent, monitors and equipment checked, pre-op evaluation and timeout performed  Epidural Patient position: sitting Prep: ChloraPrep Patient monitoring: heart rate, continuous pulse ox and blood pressure Approach: midline Location: L3-L4 Injection technique: LOR saline  Needle:  Needle type: Tuohy  Needle gauge: 17 G Needle length: 9 cm and 9 Needle insertion depth: 6 cm Catheter type: closed end flexible Catheter size: 19 Gauge Catheter at skin depth: 12 cm Test dose: negative and 1.5% lidocaine with Epi 1:200 K  Assessment Sensory level: T10 Events: blood not aspirated, injection not painful, no injection resistance, no paresthesia and negative IV test  Additional Notes 1 attempt Pt. Evaluated and documentation done after procedure finished. Patient identified. Risks/Benefits/Options discussed with patient including but not limited to bleeding, infection, nerve damage, paralysis, failed block, incomplete pain control, headache, blood pressure changes, nausea, vomiting, reactions to medication both or allergic, itching and postpartum back pain. Confirmed with bedside nurse the patient's most recent platelet count. Confirmed with patient that they are not currently taking any anticoagulation, have any bleeding history or any family history of bleeding disorders. Patient expressed understanding and wished to proceed. All questions were answered. Sterile technique was used throughout the entire procedure. Please see nursing notes for vital signs. Test dose was given through epidural catheter and negative prior to continuing to dose epidural or start infusion. Warning signs of high  block given to the patient including shortness of breath, tingling/numbness in hands, complete motor block, or any concerning symptoms with instructions to call for help. Patient was given instructions on fall risk and not to get out of bed. All questions and concerns addressed with instructions to call with any issues or inadequate analgesia.    Patient tolerated the insertion well without immediate complications. Reason for block:procedure for pain

## 2023-12-26 NOTE — H&P (Signed)
 [address] Renee Hart is a 33 y.o. female presenting for SROM. Around 945 PM she was siting and reading and felt a "pop" and  gush of fluid, soon after she felt a few few trickles of fluids. Contractions started about 30 mins after SROM. She endorses +FM. Denies vaginal bleeding. ROM plus positive.  Lamika had early and regular prenatal care. Her pregnancy has been complicated by hx c/s for macrosomia. In her last pregnancy she pushed for multiple hours followed by a failed vacuum that infant's birth weight was 4210 grams. An [place] on 2/7 showed an EFW 2618 grams, 52% with AC in the 86%.  OB History     Gravida  2   Para  1   Term  1   Preterm  0   AB  0   Living  1      SAB  0   IAB  0   Ectopic  0   Multiple  0   Live Births  1          Past Medical History:  Diagnosis Date   Dysplastic nevus 09/03/2023   R super pubic vulva, moderate atypia   Medical history non-contributory    Past Surgical History:  Procedure Laterality Date   CESAREAN SECTION  05/07/2021   Procedure: CESAREAN SECTION;  Surgeon: Hildred Laser, MD;  Location: [facility] ORS;  Service: Obstetrics;;   EYE SURGERY     PRK   KNEE ARTHROSCOPY WITH MEDIAL MENISECTOMY Right 08/28/2018   Procedure: KNEE ARTHROSCOPY WITH MEDIAL OR LATERAL MENISECTOMY;  Surgeon: Juanell Fairly, MD;  Location: [facility] ORS;  Service: Orthopedics;  Laterality: Right;   REFRACTIVE SURGERY     Family History: family history includes Cancer (age of onset: 61) in her maternal grandmother; Cancer (age of onset: 51) in her paternal grandmother; Healthy in her father, maternal grandfather, mother, paternal grandfather, sister, and sister; Ovarian cancer (age of onset: 81) in her maternal grandmother. Social History:  reports that she has never smoked. She has never used smokeless tobacco. She reports that she does not currently use alcohol after a past usage of about 2.0 standard drinks of alcohol per week. She reports that she does not use  drugs.     Maternal Diabetes: No Genetic Screening: Normal Maternal Ultrasounds/Referrals: Normal Fetal Ultrasounds or other Referrals:  None Maternal Substance Abuse:  No Significant Maternal Medications:  None Significant Maternal Lab Results:  Group B Strep negative Number of Prenatal Visits:greater than 3 verified prenatal visits Maternal Vaccinations:RSV: Given during pregnancy >/=14 days ago and TDap Other Comments:  None  Review of Systems History Dilation: 3 Effacement (%): 50 Station: -2 Exam by:: Chika Okafor RN Blood pressure 123/81, pulse 99, temperature 98 F (36.7 C), temperature source Oral, resp. rate 18, last menstrual period 03/23/2023, currently breastfeeding. Exam Physical Exam Constitutional:      Appearance: Normal appearance.  Cardiovascular:     Rate and Rhythm: Normal rate.  Pulmonary:     Effort: Pulmonary effort is normal.  Abdominal:     Tenderness: There is no abdominal tenderness.     Comments: Gravid, EFW 7.5-8lbs   Musculoskeletal:     Cervical back: Normal range of motion.     Right lower leg: No edema.     Left lower leg: No edema.  Skin:    General: Skin is warm.  Neurological:     General: No focal deficit present.     Mental Status: She is alert.  Psychiatric:  Mood and Affect: Mood normal.    REILYN Hart [birth year] [redacted]w[redacted]d  Fetus A Non-Stress Test Interpretation for 12/26/23  Indication:  SROM   Fetal Heart Rate A Mode: External Baseline Rate (A): 150 bpm Variability: Moderate Accelerations: 15 x 15 Decelerations: None  Uterine Activity Mode: Palpation, Toco Contraction Frequency (min): 2-6 Contraction Duration (sec): 60-120 Contraction Quality: Mild, Moderate Resting Tone Palpated: Relaxed Resting Time: Adequate      Prenatal labs: ABO, Rh: O/Positive/-- (08/30 1114) Antibody: Negative (08/30 1114) Rubella: 12.60 (08/30 1114) RPR: Non Reactive (12/18 0911)  HBsAg: Negative (08/30 1114)  HIV:  Non Reactive (12/18 0911)  GBS: Negative/-- (02/14 1610)   Assessment/Plan: [id number] at 39wks5d admitted for SROM in early labor, TOLAC   Discussed options in the setting of GBS negative, ruptured with contractions. 1) expectant management 2) augmentation with Pitocin. Pt prefers expectant management.   -Admitting labs ordered -Category I tracing, continuous EFM  -IV to HL  -TOLAC-consent signed -Pian management: planning epidural   Dr Logan Bores aware of admission and plan    Ellouise Newer [facility] 12/26/2023, 12:47 AM

## 2023-12-26 NOTE — Discharge Summary (Signed)
 Postpartum Discharge Summary  Date of Service updated***     Patient Name: Renee Hart DOB: Jan 11, 1991 MRN: [id number]  Date of admission: 12/25/2023 Delivery date:12/26/2023 Delivering provider: Carie Caddy  Date of discharge: 12/26/2023  Admitting diagnosis: Indication for care in labor and delivery, antepartum [O75.9] Intrauterine pregnancy: [redacted]w[redacted]d     Secondary diagnosis:  Principal Problem:   Indication for care in labor and delivery, antepartum  Additional problems: hx LTCS     Discharge diagnosis: VBAC                                              Post partum procedures:{Postpartum procedures:23558} Augmentation: N/A Complications: None  Hospital course: Onset of Labor With Vaginal Delivery      33 y.o. yo G2P1001 at [redacted]w[redacted]d was admitted in early labor on 12/25/2023. Labor course was complicated by category II tracing near the end of labor.   Membrane Rupture Time/Date: 9:45 PM,12/26/2023  Delivery Method:Vaginal, Spontaneous Operative Delivery:N/A Episiotomy: None Lacerations:  3rd degree;Perineal Patient had a postpartum course complicated by ***.  She is ambulating, tolerating a regular diet, passing flatus, and urinating well. Patient is discharged home in stable condition on 12/26/23.  Newborn Data: Birth date:12/26/2023 Birth time:7:35 AM Gender:Female Living status:Living Apgars:8 ,9  Weight:   Magnesium Sulfate received: No BMZ received: No Rhophylac:N/A MMR:N/A T-DaP:Given prenatally Flu: No RSV Vaccine received: Yes Transfusion:{Transfusion received:30440034} Immunizations administered: Immunization History  Administered Date(s) Administered   Influenza,inj,Quad PF,6+ Mos 07/16/2022   Influenza-Unspecified 06/26/2018, 07/14/2020   PFIZER(Purple Top)SARS-COV-2 Vaccination 10/05/2019, 11/03/2019, 07/16/2020   Rsv, Bivalent, Protein Subunit Rsvpref,pf Verdis Frederickson) 11/02/2023   Tdap 02/23/2021, 10/03/2023    Physical exam  Vitals:   12/26/23  0308 12/26/23 0355 12/26/23 0524 12/26/23 0541  BP: 108/77  104/72   Pulse: 95  95   Resp:  16    Temp:  98 F (36.7 C)  98.2 F (36.8 C)  TempSrc:  Oral  Oral  SpO2: 97%  97%   Weight:      Height:       General: {Exam; general:21111117} Lochia: {Desc; appropriate/inappropriate:30686::"appropriate"} Uterine Fundus: {Desc; firm/soft:30687} Incision: {Exam; incision:21111123} DVT Evaluation: {Exam; dvt:2111122} Labs: Lab Results  Component Value Date   WBC 10.1 12/26/2023   HGB 12.2 12/26/2023   HCT 36.2 12/26/2023   MCV 92.6 12/26/2023   PLT 172 12/26/2023      Latest Ref Rng & Units 05/24/2023   11:23 AM  CMP  Glucose 70 - 99 mg/dL 91   BUN 6 - 20 mg/dL 9   Creatinine 6.57 - 8.46 mg/dL 9.62   Sodium 952 - 841 mmol/L 134   Potassium 3.5 - 5.2 mmol/L 4.0   Chloride 96 - 106 mmol/L 99   CO2 20 - 29 mmol/L 21   Calcium 8.7 - 10.2 mg/dL 9.2   Total Protein 6.0 - 8.5 g/dL 6.7   Total Bilirubin 0.0 - 1.2 mg/dL 0.3   Alkaline Phos 44 - 121 IU/L 71   AST 0 - 40 IU/L 16   ALT 0 - 32 IU/L 10    Edinburgh Score:    05/17/2023    3:34 PM  Edinburgh Postnatal Depression Scale Screening Tool  I have been able to laugh and see the funny side of things. 0  I have looked forward with enjoyment to things. 0  I have  blamed myself unnecessarily when things went wrong. 0  I have been anxious or worried for no good reason. 0  I have felt scared or panicky for no good reason. 0  Things have been getting on top of me. 0  I have been so unhappy that I have had difficulty sleeping. 0  I have felt sad or miserable. 0  I have been so unhappy that I have been crying. 0  The thought of harming myself has occurred to me. 0  Edinburgh Postnatal Depression Scale Total 0      After visit meds:  Allergies as of 12/26/2023   No Known Allergies   Med Rec must be completed prior to using this [facility]***        Discharge home in stable condition Infant Feeding: Breast Infant  Disposition:home with mother Discharge instruction: per After Visit Summary and Postpartum booklet. Activity: Advance as tolerated. Pelvic rest for 6 weeks.  Diet: routine diet Anticipated Birth Control: POPs Postpartum Appointment:2 weeks Additional Postpartum F/U: Postpartum Depression checkup Future Appointments: Future Appointments  Date Time Provider Department Center  12/28/2023  1:15 PM Free, Lindalou Hose, CNM [facility] None  12/31/2023  4:00 PM [facility] [place] 1 [facility] None  02/11/2024  8:30 AM Willeen Niece, MD [facility] None  05/26/2024  8:20 AM Reubin Milan, MD [facility]   Follow up Visit:  Follow-up Information     Rockford Leinen, Courtney Heys, CNM Follow up in 2 week(s).   Specialty: Obstetrics and Gynecology Why: 2 and 6 wk PP visit Contact information: [address] [place] [address] [phone]                     12/26/2023 Ellouise Newer Matther Labell, CNM

## 2023-12-27 LAB — CBC
HCT: 28.5 % — ABNORMAL LOW (ref 36.0–46.0)
Hemoglobin: 9.4 g/dL — ABNORMAL LOW (ref 12.0–15.0)
MCH: 31.4 pg (ref 26.0–34.0)
MCHC: 33 g/dL (ref 30.0–36.0)
MCV: 95.3 fL (ref 80.0–100.0)
Platelets: 127 10*3/uL — ABNORMAL LOW (ref 150–400)
RBC: 2.99 MIL/uL — ABNORMAL LOW (ref 3.87–5.11)
RDW: 15.2 % (ref 11.5–15.5)
WBC: 10.3 10*3/uL (ref 4.0–10.5)
nRBC: 0 % (ref 0.0–0.2)

## 2023-12-27 MED ORDER — DOCUSATE SODIUM 100 MG PO CAPS: 100.0000 mg | ORAL_CAPSULE | Freq: Two times a day (BID) | ORAL | 2 refills | Status: DC

## 2023-12-27 MED ORDER — SENNOSIDES-DOCUSATE SODIUM 8.6-50 MG PO TABS: 2.0000 | ORAL_TABLET | ORAL | 0 refills | Status: DC

## 2023-12-27 MED ORDER — ACETAMINOPHEN 325 MG PO TABS: 650.0000 mg | ORAL_TABLET | ORAL | Status: DC | PRN

## 2023-12-27 MED ORDER — IBUPROFEN 600 MG PO TABS: 600.0000 mg | ORAL_TABLET | Freq: Four times a day (QID) | ORAL | 0 refills | Status: DC

## 2023-12-27 NOTE — Anesthesia Postprocedure Evaluation (Signed)
 Anesthesia Post Note  Patient: Renee Hart  Procedure(s) Performed: AN AD HOC LABOR EPIDURAL  Patient location during evaluation: Mother Baby Anesthesia Type: Epidural Level of consciousness: oriented and awake and alert Pain management: pain level controlled Vital Signs Assessment: post-procedure vital signs reviewed and stable Respiratory status: spontaneous breathing and respiratory function stable Cardiovascular status: blood pressure returned to baseline and stable Postop Assessment: no headache, no backache, no apparent nausea or vomiting and able to ambulate Anesthetic complications: no  No notable events documented.   Last Vitals:  Vitals:   12/26/23 1945 12/26/23 2229  BP: 119/75 108/77  Pulse: (!) 110 (!) 110  Resp: 18 18  Temp: 36.8 C 36.6 C  SpO2: 97% 97%    Last Pain:  Vitals:   12/27/23 0100  TempSrc:   PainSc: 0-No pain                 Starling Manns

## 2023-12-27 NOTE — Discharge Instructions (Signed)

## 2023-12-27 NOTE — Lactation Note (Signed)
 This note was copied from a baby's chart. Lactation Consultation Note  Patient Name: Boy Vernel Donlan [id number] Date: 12/27/2023 Age:34 hours Reason for consult: Follow-up assessment;Term;Other (Comment) (Discharge Education)   Maternal Data Lactation to room for a follow up assessment and to provide discharge education.  Baby boy "Arville Care" is 26hrs old.  Patient stated that she thinks breastfeeding is going well.   Feeding Mother's Current Feeding Choice: Breast Milk  Interventions Interventions: Breast feeding basics reviewed;Education;CDC milk storage guidelines  Discharge Discharge Education: Engorgement and breast care;Warning signs for feeding baby;Outpatient recommendation  Education on engorgement prevention/treatment was discussed as well as breastmilk storage guidelines.  LC reviewed the difference between engorgement/mastitis based off of moms previous experience w/ mastitis.  LC provided patient with a handout on breastmilk storage guidelines from [facility]. [facility] outpatient lactation services phone number written on the white board in the room.  Patient verbalized understanding.  LC also provided education from the postpartum book about warning signs to look for in a poor feeding.    Consult Status Consult Status: Complete Follow-up type: Call as needed    Yvette Rack Kaprice Kage 12/27/2023, 11:27 AM

## 2023-12-28 ENCOUNTER — Encounter

## 2023-12-31 ENCOUNTER — Other Ambulatory Visit

## 2024-01-04 ENCOUNTER — Telehealth: Payer: Self-pay | Admitting: Licensed Practical Nurse

## 2024-01-04 NOTE — Telephone Encounter (Signed)
 Reached out to pt to schedule 2 postpartum appts-2 week MyChart video visit and 6 week pp visit.  Left message for pt to call back to get scheduled.

## 2024-01-07 NOTE — Telephone Encounter (Signed)
 Pt is scheduled for 2 week pp visit (MyChart Video visit) on 01/11/2024 with LMD.  Scheduled for 6 week pp visit on 02/06/2024 with LMD.

## 2024-01-11 ENCOUNTER — Telehealth: Admitting: Licensed Practical Nurse

## 2024-01-11 ENCOUNTER — Encounter: Payer: Self-pay | Admitting: Licensed Practical Nurse

## 2024-01-11 DIAGNOSIS — Z1332 Encounter for screening for maternal depression: Secondary | ICD-10-CM | POA: Diagnosis not present

## 2024-01-11 MED ORDER — NORETHINDRONE 0.35 MG PO TABS
1.0000 | ORAL_TABLET | Freq: Every day | ORAL | 11 refills | Status: DC
Start: 2024-01-11 — End: 2024-05-26

## 2024-01-11 NOTE — Progress Notes (Signed)
 Virtual Visit via Video Note  I connected with Renee Hart on 01/11/24 at  2:35 PM EDT by a video enabled telemedicine application and verified that I am speaking with the correct person using two identifiers.  Location: Patient: [place]  Provider: [place]    I discussed the limitations of evaluation and management by telemedicine and the availability of in person appointments. The patient expressed understanding and agreed to proceed.  History of Present Illness: 12/26/23 VBAC w/ LMD, 3rd degree laceration repaired by Dr Logan Bores   -Bleeding: starting to turn pink brown, light in amount  -Sleep: not bad, 2 hour stretches, gets naps -Appetite: good, wants sweets -No concerns voiding or stooling, but using stool softener, has Hemorrhoids  tucks and prep H  -No concerns with perineum -Breastfeeding: going well, first week engorgement was challenging, pumping and bottle feeding, he prefers bottle, has tried laid back nursing. Side lying helps  Pumping: express 4oz each side -Husband at home-goes back to work next week, had a lot of visitors at first week  -Older child doing fine, normal toddler tantrums  also excited to see brother -Mood is good, this transition has been easier. -Has gone on outings  -Back to work at  12 weeks -No more kids, planning pill. Did not like nexplanon had weight gain and anxiety   Observations/Objective: Gen: NAD EPDS 1  Assessment and Plan: Normal PP exam at 2 wks Screening for maternal depression  Follow Up Instructions: -May increase physical activity -RTC in 4 weeks  -Last pap 2023  -Script for POP sent, may start anytime after 2 weeks    I discussed the assessment and treatment plan with the patient. The patient was provided an opportunity to ask questions and all were answered. The patient agreed with the plan and demonstrated an understanding of the instructions.   The patient was advised to call back or seek an in-person evaluation  if the symptoms worsen or if the condition fails to improve as anticipated.  I provided 15 minutes of non-face-to-face time during this encounter.   Ellouise Newer Kati Riggenbach, CNM

## 2024-02-06 ENCOUNTER — Ambulatory Visit (INDEPENDENT_AMBULATORY_CARE_PROVIDER_SITE_OTHER): Admitting: Licensed Practical Nurse

## 2024-02-06 ENCOUNTER — Encounter: Payer: Self-pay | Admitting: Licensed Practical Nurse

## 2024-02-06 DIAGNOSIS — Z1332 Encounter for screening for maternal depression: Secondary | ICD-10-CM | POA: Diagnosis not present

## 2024-02-06 NOTE — Progress Notes (Signed)
 Postpartum Visit  Chief Complaint:  Chief Complaint  Patient presents with   Postpartum Care    6 week postpartum     History of Present Illness: Patient is a 33 y.o. Renee Hart presents for postpartum visit.  Date of delivery: 12/26/2022 Type of delivery: VBAC Episiotomy No.  Laceration: yes third degree repaired by Dr Luster Salters Pregnancy or labor problems:  no Any problems since the delivery:  no -Bleeding has stopped -Sleep is good gets 2 to 4 hour stretches, 6 to 7 hours in 24 hour period -No concerns with voiding or stooling but does have hemorrhoids -Breastfeeding is going well, has a fast let down so pumps and gives EBM during the day and nurses at night. Sometimes the infant has a hard time latching -Mood has been good, this transition has been much easier compared to her first abby -has been going on walks every other day -Returns to work In June, it  bittersweet about this  -has not had IC yet, plan to start POP on May first   Newborn Details:  SINGLETON :  1. Baby's name: female. Birth weight: 3660grams Maternal Details:  Breast Feeding:  yes Post partum depression/anxiety noted:  no Edinburgh Post-Partum Depression Score:  4  Date of last PAP: 2023  normal   Past Medical History:  Diagnosis Date   Dysplastic nevus 09/03/2023   R super pubic vulva, moderate atypia   Medical history non-contributory     Past Surgical History:  Procedure Laterality Date   CESAREAN SECTION  05/07/2021   Procedure: CESAREAN SECTION;  Surgeon: Teresa Fender, MD;  Location: [facility] ORS;  Service: Obstetrics;;   EYE SURGERY     PRK   KNEE ARTHROSCOPY WITH MEDIAL MENISECTOMY Right 08/28/2018   Procedure: KNEE ARTHROSCOPY WITH MEDIAL OR LATERAL MENISECTOMY;  Surgeon: Rande Bushy, MD;  Location: [facility] ORS;  Service: Orthopedics;  Laterality: Right;   REFRACTIVE SURGERY      Prior to Admission medications   Medication Sig Start Date End Date Taking? Authorizing  Provider  norethindrone  (MICRONOR ) 0.35 MG tablet Take 1 tablet (0.35 mg total) by mouth daily. 01/11/24  Yes Zriyah Kopplin, Alva Jewels, CNM  Prenatal Vit-Fe Fumarate-FA (MULTIVITAMIN-PRENATAL) 27-0.8 MG TABS tablet Take 1 tablet by mouth daily at 12 noon.   Yes [provider]  acetaminophen  (TYLENOL ) 325 MG tablet Take 2 tablets (650 mg total) by mouth every 4 (four) hours as needed (for pain scale < 4). Patient not taking: Reported on 02/06/2024 12/27/23   Slaughterbeck, Sherline Distel, CNM  docusate sodium  (COLACE) 100 MG capsule Take 1 capsule (100 mg total) by mouth 2 (two) times daily. Patient not taking: Reported on 02/06/2024 12/27/23 12/26/24  Slaughterbeck, Sherline Distel, CNM  ibuprofen  (ADVIL ) 600 MG tablet Take 1 tablet (600 mg total) by mouth every 6 (six) hours. Patient not taking: Reported on 02/06/2024 12/27/23   Slaughterbeck, Sherline Distel, CNM  senna-docusate (SENOKOT-S) 8.6-50 MG tablet Take 2 tablets by mouth daily. Patient not taking: Reported on 02/06/2024 12/28/23   Slaughterbeck, Sherline Distel, CNM    No Known Allergies   Social History   Socioeconomic History   Marital status: Married    Spouse name: Zackary Heron   Number of children: 1   Years of education: 14   Highest education level: Associate degree: occupational, Scientist, product/process development, or vocational program  Occupational History   Occupation: MRI Theme park manager: [employer]    Comment: [place]  Tobacco Use   Smoking status: Never   Smokeless tobacco: Never  Vaping Use  Vaping status: Never Used  Substance and Sexual Activity   Alcohol use: Not Currently    Alcohol/week: 2.0 standard drinks of alcohol    Types: 2 Glasses of wine per week    Comment: socially   Drug use: Never   Sexual activity: Yes    Partners: Male    Birth control/protection: Pill  Other Topics Concern   Not on file  Social History Narrative   Not on file   Social Drivers of Health   Financial Resource Strain: Low Risk  (05/23/2023)   Overall Financial Resource Strain  (CARDIA)    Difficulty of Paying Living Expenses: Not very hard  Food Insecurity: No Food Insecurity (12/26/2023)   Hunger Vital Sign    Worried About Running Out of Food in the Last Year: Never true    Ran Out of Food in the Last Year: Never true  Transportation Needs: No Transportation Needs (12/26/2023)   PRAPARE - Administrator, Civil Service (Medical): No    Lack of Transportation (Non-Medical): No  Physical Activity: Insufficiently Active (05/23/2023)   Exercise Vital Sign    Days of Exercise per Week: 2 days    Minutes of Exercise per Session: 20 min  Stress: No Stress Concern Present (05/23/2023)   [employer] of Occupational Health - Occupational Stress Questionnaire    Feeling of Stress : Only a little  Social Connections: Moderately Isolated (05/23/2023)   Social Connection and Isolation Panel [NHANES]    Frequency of Communication with Friends and Family: Three times a week    Frequency of Social Gatherings with Friends and Family: Once a week    Attends Religious Services: Never    Database administrator or Organizations: No    Attends Banker Meetings: Never    Marital Status: Married  Catering manager Violence: Not At Risk (12/26/2023)   Humiliation, Afraid, Rape, and Kick questionnaire    Fear of Current or Ex-Partner: No    Emotionally Abused: No    Physically Abused: No    Sexually Abused: No    Family History  Problem Relation Age of Onset   Healthy Mother    Healthy Father    Healthy Sister    Healthy Sister    Ovarian cancer Maternal Grandmother 50   Cancer Maternal Grandmother 30       breast   Healthy Maternal Grandfather    Cancer Paternal Grandmother 19       breast   Healthy Paternal Grandfather     ROS see HPI   Physical Exam BP 110/77 (BP Location: Right Arm, Patient Position: Sitting, Cuff Size: Normal)   Pulse 71   Ht 5\' 4"  (1.626 m)   Wt 150 lb 1.6 oz (68.1 kg)   LMP 03/23/2023 (Exact Date)   Breastfeeding  Yes   BMI 25.76 kg/m   Physical Exam Constitutional:      Appearance: Normal appearance.  Genitourinary:     Vulva normal.     Genitourinary Comments: Laceration healed Bimanual exam: uterus non gravid non tender no masses, adnexa non tender no masse not enlarged, some tone present   Cardiovascular:     Rate and Rhythm: Normal rate and regular rhythm.     Pulses: Normal pulses.     Heart sounds: Normal heart sounds.  Pulmonary:     Effort: Pulmonary effort is normal.     Breath sounds: Normal breath sounds.  Chest:     Comments: Breasts:lactating,no masses,  small  bruise on right breast near nipple, nipples erect and intact bilaterally  Abdominal:     General: Abdomen is flat.     Tenderness: There is no abdominal tenderness.  Musculoskeletal:     Cervical back: Normal range of motion and neck supple.     Right lower leg: No edema.     Left lower leg: No edema.  Neurological:     General: No focal deficit present.     Mental Status: She is alert.  Skin:    General: Skin is warm.  Psychiatric:        Mood and Affect: Mood normal.        Thought Content: Thought content normal.     Assessment: 33 y.o. Renee Hart presenting for 6 week postpartum visit  Plan: Problem List Items Addressed This Visit   None    1) Contraception Education given regarding options for contraception, including oral contraceptives.  2)  Pap - ASCCP guidelines and rational discussed.  Patient opts for 3year screening interval due in 2026   3) Patient underwent screening for postpartum depression with No concerns noted.  4) Follow up 1 year for routine annual exam  Ludger [place]  [facility]  02/06/24  9:22 AM

## 2024-02-11 ENCOUNTER — Ambulatory Visit (INDEPENDENT_AMBULATORY_CARE_PROVIDER_SITE_OTHER): Payer: 59 | Admitting: Dermatology

## 2024-02-11 DIAGNOSIS — L821 Other seborrheic keratosis: Secondary | ICD-10-CM

## 2024-02-11 DIAGNOSIS — D2239 Melanocytic nevi of other parts of face: Secondary | ICD-10-CM | POA: Diagnosis not present

## 2024-02-11 DIAGNOSIS — B079 Viral wart, unspecified: Secondary | ICD-10-CM

## 2024-02-11 DIAGNOSIS — D1801 Hemangioma of skin and subcutaneous tissue: Secondary | ICD-10-CM

## 2024-02-11 DIAGNOSIS — D485 Neoplasm of uncertain behavior of skin: Secondary | ICD-10-CM

## 2024-02-11 DIAGNOSIS — L578 Other skin changes due to chronic exposure to nonionizing radiation: Secondary | ICD-10-CM | POA: Diagnosis not present

## 2024-02-11 DIAGNOSIS — L814 Other melanin hyperpigmentation: Secondary | ICD-10-CM | POA: Diagnosis not present

## 2024-02-11 DIAGNOSIS — Z1283 Encounter for screening for malignant neoplasm of skin: Secondary | ICD-10-CM

## 2024-02-11 DIAGNOSIS — B078 Other viral warts: Secondary | ICD-10-CM

## 2024-02-11 DIAGNOSIS — D225 Melanocytic nevi of trunk: Secondary | ICD-10-CM

## 2024-02-11 DIAGNOSIS — I781 Nevus, non-neoplastic: Secondary | ICD-10-CM

## 2024-02-11 DIAGNOSIS — W908XXA Exposure to other nonionizing radiation, initial encounter: Secondary | ICD-10-CM

## 2024-02-11 DIAGNOSIS — Z86018 Personal history of other benign neoplasm: Secondary | ICD-10-CM

## 2024-02-11 DIAGNOSIS — D220 Melanocytic nevi of lip: Secondary | ICD-10-CM | POA: Diagnosis not present

## 2024-02-11 DIAGNOSIS — D492 Neoplasm of unspecified behavior of bone, soft tissue, and skin: Secondary | ICD-10-CM

## 2024-02-11 DIAGNOSIS — D229 Melanocytic nevi, unspecified: Secondary | ICD-10-CM

## 2024-02-11 NOTE — Patient Instructions (Addendum)
 Recommend using Curad Mediplast pads. Cut to fit wart or callus. Cover with Elastoplast waterproof tape or any waterproof band-aid. Change every 3 to 4 days, or sooner if necessary.  Treatment may require several months of regular use before results are seen.   Discussed resulting small scar with shave removal, and possible recurrence of lesion.  Recommend vaseline ointment to area daily and cover until healed.  Recommend photoprotection/sunscreen to area to prevent discoloration of scar.  Once healed, may apply OTC Serica scar gel bid to thickened scars.  Wound Care Instructions  Cleanse wound gently with soap and water once a day then pat dry with clean gauze. Apply a thin coat of Petrolatum (petroleum jelly, "Vaseline") over the wound (unless you have an allergy to this). We recommend that you use a new, sterile tube of Vaseline. Do not pick or remove scabs. Do not remove the yellow or white "healing tissue" from the base of the wound.  Cover the wound with fresh, clean, nonstick gauze and secure with paper tape. You may use Band-Aids in place of gauze and tape if the wound is small enough, but would recommend trimming much of the tape off as there is often too much. Sometimes Band-Aids can irritate the skin.  You should call the office for your biopsy report after 1 week if you have not already been contacted.  If you experience any problems, such as abnormal amounts of bleeding, swelling, significant bruising, significant pain, or evidence of infection, please call the office immediately.  FOR ADULT SURGERY PATIENTS: If you need something for pain relief you may take 1 extra strength Tylenol  (acetaminophen ) AND 2 Ibuprofen  (200mg  each) together every 4 hours as needed for pain. (do not take these if you are allergic to them or if you have a reason you should not take them.) Typically, you may only need pain medication for 1 to 3 days.      Melanoma ABCDEs  Melanoma is the most dangerous  type of skin cancer, and is the leading cause of death from skin disease.  You are more likely to develop melanoma if you: Have light-colored skin, light-colored eyes, or red or blond hair Spend a lot of time in the sun Tan regularly, either outdoors or in a tanning bed Have had blistering sunburns, especially during childhood Have a close family member who has had a melanoma Have atypical moles or large birthmarks  Early detection of melanoma is key since treatment is typically straightforward and cure rates are extremely high if we catch it early.   The first sign of melanoma is often a change in a mole or a new dark spot.  The ABCDE system is a way of remembering the signs of melanoma.  A for asymmetry:  The two halves do not match. B for border:  The edges of the growth are irregular. C for color:  A mixture of colors are present instead of an even brown color. D for diameter:  Melanomas are usually (but not always) greater than 6mm - the size of a pencil eraser. E for evolution:  The spot keeps changing in size, shape, and color.  Please check your skin once per month between visits. You can use a small mirror in front and a large mirror behind you to keep an eye on the back side or your body.   If you see any new or changing lesions before your next follow-up, please call to schedule a visit.  Please continue daily skin  protection including broad spectrum sunscreen SPF 30+ to sun-exposed areas, reapplying every 2 hours as needed when you're outdoors.   Staying in the shade or wearing long sleeves, sun glasses (UVA+UVB protection) and wide brim hats (4-inch brim around the entire circumference of the hat) are also recommended for sun protection.     Due to recent changes in healthcare laws, you may see results of your pathology and/or laboratory studies on MyChart before the doctors have had a chance to review them. We understand that in some cases there may be results that are  confusing or concerning to you. Please understand that not all results are received at the same time and often the doctors may need to interpret multiple results in order to provide you with the best plan of care or course of treatment. Therefore, we ask that you please give us  2 business days to thoroughly review all your results before contacting the office for clarification. Should we see a critical lab result, you will be contacted sooner.   If You Need Anything After Your Visit  If you have any questions or concerns for your doctor, please call our main line at [phone] and press option 4 to reach your doctor's medical assistant. If no one answers, please leave a voicemail as directed and we will return your call as soon as possible. Messages left after 4 pm will be answered the following business day.   You may also send us  a message via MyChart. We typically respond to MyChart messages within 1-2 business days.  For prescription refills, please ask your pharmacy to contact our office. Our fax number is [phone].  If you have an urgent issue when the clinic is closed that cannot wait until the next business day, you can page your doctor at the number below.    Please note that while we do our best to be available for urgent issues outside of office hours, we are not available 24/7.   If you have an urgent issue and are unable to reach us , you may choose to seek medical care at your doctor's office, retail clinic, urgent care center, or emergency room.  If you have a medical emergency, please immediately call 911 or go to the emergency department.  Pager Numbers  - Dr. Bary Likes: [phone]  - Dr. Annette Barters: [phone]  - Dr. Felipe Horton: [phone]   In the event of inclement weather, please call our main line at [phone] for an update on the status of any delays or closures.  Dermatology Medication Tips: Please keep the boxes that topical medications come in in order to  help keep track of the instructions about where and how to use these. Pharmacies typically print the medication instructions only on the boxes and not directly on the medication tubes.   If your medication is too expensive, please contact our office at [phone] option 4 or send us  a message through MyChart.   We are unable to tell what your co-pay for medications will be in advance as this is different depending on your insurance coverage. However, we may be able to find a substitute medication at lower cost or fill out paperwork to get insurance to cover a needed medication.   If a prior authorization is required to get your medication covered by your insurance company, please allow us  1-2 business days to complete this process.  Drug prices often vary depending on where the prescription is filled and some pharmacies may offer cheaper prices.  The website  www.goodrx.com contains coupons for medications through different pharmacies. The prices here do not account for what the cost may be with help from insurance (it may be cheaper with your insurance), but the website can give you the price if you did not use any insurance.  - You can print the associated coupon and take it with your prescription to the pharmacy.  - You may also stop by our office during regular business hours and pick up a GoodRx coupon card.  - If you need your prescription sent electronically to a different pharmacy, notify our office through [facility] or by phone at [phone] option 4.     Si Usted Necesita Algo Despus de Su Visita  Tambin puede enviarnos un mensaje a travs de Clinical cytogeneticist. Por lo general respondemos a los mensajes de MyChart en el transcurso de 1 a 2 das hbiles.  Para renovar recetas, por favor pida a su farmacia que se ponga en contacto con nuestra oficina. Franz Jacks de fax es [place] [phone].  Si tiene un asunto urgente cuando la clnica est cerrada y que no puede esperar hasta  el siguiente da hbil, puede llamar/localizar a su doctor(a) al nmero que aparece a continuacin.   Por favor, tenga en cuenta que aunque hacemos todo lo posible para estar disponibles para asuntos urgentes fuera del horario de [place], no estamos disponibles las 24 horas del da, los 7 [address]  [address] de la [place].   Si tiene un problema urgente y no puede comunicarse con nosotros, puede optar por buscar atencin mdica  en el consultorio de su doctor(a), en una clnica privada, en un centro de atencin urgente o en una sala de emergencias.  Si tiene Engineer, drilling, por favor llame inmediatamente al 911 o vaya a la sala de emergencias.  Nmeros de bper  - Dr. Bary Likes: [phone]  - Dra. Annette Barters: [phone]  - Dr. Felipe Horton: [phone]   En caso de inclemencias del tiempo, por favor llame a Lajuan Pila principal al [phone] para una actualizacin sobre el [place] de cualquier retraso o cierre.  Consejos para la medicacin en dermatologa: Por favor, guarde las cajas en las que vienen los medicamentos de uso tpico para ayudarle a seguir las instrucciones sobre dnde y cmo usarlos. Las farmacias generalmente imprimen las instrucciones del medicamento slo en las cajas y no directamente en los tubos del [place].   Si su medicamento es muy caro, por favor, pngase en contacto con Bettyjane Brunet llamando al [phone] y presione la opcin 4 o envenos un mensaje a travs de Clinical cytogeneticist.   No podemos decirle cul ser su copago por los medicamentos por adelantado ya que esto es diferente dependiendo de la cobertura de su seguro. Sin embargo, es posible que podamos encontrar un medicamento sustituto a Audiological scientist un formulario para que el seguro cubra el medicamento que se considera necesario.   Si se requiere una autorizacin previa para que su compaa de seguros [place] su medicamento, por favor permtanos de 1 a 2 das hbiles para completar este proceso.  Los precios de  los medicamentos varan con frecuencia dependiendo del Environmental consultant de dnde se surte la receta y alguna farmacias pueden ofrecer precios ms baratos.  El sitio web www.goodrx.com tiene cupones para medicamentos de Health and safety inspector. Los precios aqu no tienen en cuenta lo que podra costar con la ayuda del seguro (puede ser ms barato con su seguro), pero el sitio web puede darle el precio si no utiliz Tourist information centre manager.  - Puede  imprimir el cupn correspondiente y llevarlo con su receta a la farmacia.  - Tambin puede pasar por nuestra oficina durante el horario de atencin regular y Education officer, museum una tarjeta de cupones de GoodRx.  - Si necesita que su receta se enve electrnicamente a una farmacia diferente, informe a nuestra oficina a travs de MyChart de [place] o por telfono llamando al [phone] y presione la opcin 4.

## 2024-02-11 NOTE — Progress Notes (Signed)
 Follow-Up Visit   Subjective  Renee Hart is a 33 y.o. female who presents for the following: Skin Cancer Screening and Full Body Skin Exam  The patient presents for Total-Body Skin Exam (TBSE) for skin cancer screening and mole check. The patient has spots, moles and lesions to be evaluated, some may be new or changing. History of dysplastic nevus of the right super pubic vulva. She has a couple of moles on the face she would like to have removed. She has a few bumps on her dorsal feet to check- warts?Aaron Aas   The following portions of the chart were reviewed this encounter and updated as appropriate: medications, allergies, medical history  Review of Systems:  No other skin or systemic complaints except as noted in HPI or Assessment and Plan.  Objective  Well appearing patient in no apparent distress; mood and affect are within normal limits.  A full examination was performed including scalp, head, eyes, ears, nose, lips, neck, chest, axillae, abdomen, back, buttocks, bilateral upper extremities, bilateral lower extremities, hands, feet, fingers, toes, fingernails, and toenails. All findings within normal limits unless otherwise noted below.   Relevant physical exam findings are noted in the Assessment and Plan.  Left Upper Cutaneous Lip 4.0 mm flesh papule  Mid Chin 3.0 mm flesh papule   Assessment & Plan   SKIN CANCER SCREENING PERFORMED TODAY.  ACTINIC DAMAGE - Chronic condition, secondary to cumulative UV/sun exposure - diffuse scaly erythematous macules with underlying dyspigmentation - Recommend daily broad spectrum sunscreen SPF 30+ to sun-exposed areas, reapply every 2 hours as needed.  - Staying in the shade or wearing long sleeves, sun glasses (UVA+UVB protection) and wide brim hats (4-inch brim around the entire circumference of the hat) are also recommended for sun protection.  - Call for new or changing lesions.  LENTIGINES, SEBORRHEIC KERATOSES, HEMANGIOMAS -  Benign normal skin lesions - Benign-appearing - Call for any changes  MELANOCYTIC NEVI - Tan-brown and/or pink-flesh-colored symmetric macules and papules - L lower back 4 mm medium brown thin papule - L axilla 3 mm brown papule - Benign appearing on exam today - Observation - Call clinic for new or changing moles - Recommend daily use of broad spectrum spf 30+ sunscreen to sun-exposed areas.   History of Dysplastic Nevus Right super pubic vulva, mod, 09/03/23 - No evidence of recurrence today - Recommend regular full body skin exams - Recommend daily broad spectrum sunscreen SPF 30+ to sun-exposed areas, reapply every 2 hours as needed.  - Call if any new or changing lesions are noted between office visits  TELANGIECTASIA Exam: 3 mm blanching pink macule at left breast  Treatment Plan: Benign appearing on exam Call for changes  WART Exam: verrucous papules  Counseling Discussed viral / HPV (Human Papilloma Virus) etiology and risk of spread /infectivity to other areas of body as well as to other people.  Multiple treatments and methods may be required to clear warts and it is possible treatment may not be successful.  Treatment risks include discoloration; scarring and there is still potential for wart recurrence.  Treatment Plan: Destruction Procedure Note Destruction method: cryotherapy   Informed consent: discussed and consent obtained   Lesion destroyed using liquid nitrogen: Yes   Outcome: patient tolerated procedure well with no complications   Post-procedure details: wound care instructions given   Locations: L dorsal foot x 2, R dorsal foot x 1 # of Lesions Treated: 3  Prior to procedure, discussed risks of blister formation,  small wound, skin dyspigmentation, or rare scar following cryotherapy. Recommend Vaseline ointment to treated areas while healing.   NEOPLASM OF UNCERTAIN BEHAVIOR OF SKIN (2) Left Upper Cutaneous Lip Epidermal / dermal shaving  Lesion  diameter (cm):  0.4 Informed consent: discussed and consent obtained   Patient was prepped and draped in usual sterile fashion: Area prepped with alcohol. Anesthesia: the lesion was anesthetized in a standard fashion   Anesthetic:  1% lidocaine  w/ epinephrine  1-100,000 buffered w/ 8.4% NaHCO3 Instrument used: flexible razor blade   Hemostasis achieved with: pressure, aluminum chloride and electrodesiccation   Outcome: patient tolerated procedure well   Post-procedure details: wound care instructions given   Post-procedure details comment:  Ointment and small bandage applied Specimen 1 - Surgical pathology Differential Diagnosis: Irritated Nevus vs other Check Margins: No Mid Chin Epidermal / dermal shaving  Lesion diameter (cm):  0.3 Informed consent: discussed and consent obtained   Patient was prepped and draped in usual sterile fashion: Area prepped with alcohol. Anesthesia: the lesion was anesthetized in a standard fashion   Anesthetic:  1% lidocaine  w/ epinephrine  1-100,000 buffered w/ 8.4% NaHCO3 Instrument used: flexible razor blade   Hemostasis achieved with: pressure, aluminum chloride and electrodesiccation   Outcome: patient tolerated procedure well   Post-procedure details: wound care instructions given   Post-procedure details comment:  Ointment and small bandage applied Specimen 2 - Surgical pathology Differential Diagnosis: Irritated Nevus vs other Check Margins: No *2 specimen Discussed resulting small scar with shave removal, and possible recurrence of lesion.  Recommend vaseline ointment to area daily and cover until healed.  Recommend photoprotection/sunscreen to area to prevent discoloration of scar.  Once healed, may apply OTC Serica scar gel bid to thickened scars.  Return in about 1 year (around 02/10/2025) for TBSE, Hx Dysplastic Nevus.  IBernardine Bridegroom, CMA, am acting as scribe for Artemio Larry, MD .   Documentation: I have reviewed the above  documentation for accuracy and completeness, and I agree with the above.  Artemio Larry, MD

## 2024-02-15 LAB — SURGICAL PATHOLOGY

## 2024-02-18 ENCOUNTER — Telehealth: Payer: Self-pay

## 2024-02-18 NOTE — Telephone Encounter (Signed)
 Left message advising patient biopsies done were both benign, no further treatment needed.

## 2024-02-18 NOTE — Telephone Encounter (Signed)
-----   Message from Artemio Larry sent at 02/18/2024 10:21 AM EDT ----- 1. Skin, left upper cutaneous lip :       MELANOCYTIC NEVUS, INTRADERMAL TYPE, IRRITATED  2. Skin, mid chin :       MELANOCYTIC NEVUS, INTRADERMAL TYPE, IRRITATED   Both benign irritated moles - please call patient

## 2024-05-26 ENCOUNTER — Encounter: Payer: Self-pay | Admitting: Internal Medicine

## 2024-05-26 ENCOUNTER — Ambulatory Visit (INDEPENDENT_AMBULATORY_CARE_PROVIDER_SITE_OTHER): Payer: Self-pay | Admitting: Internal Medicine

## 2024-05-26 VITALS — BP 90/60 | HR 88 | Ht 64.0 in | Wt 129.8 lb

## 2024-05-26 DIAGNOSIS — Z13220 Encounter for screening for lipoid disorders: Secondary | ICD-10-CM | POA: Diagnosis not present

## 2024-05-26 DIAGNOSIS — Z3009 Encounter for other general counseling and advice on contraception: Secondary | ICD-10-CM | POA: Diagnosis not present

## 2024-05-26 DIAGNOSIS — Z131 Encounter for screening for diabetes mellitus: Secondary | ICD-10-CM | POA: Diagnosis not present

## 2024-05-26 DIAGNOSIS — Z0000 Encounter for general adult medical examination without abnormal findings: Secondary | ICD-10-CM

## 2024-05-26 DIAGNOSIS — D509 Iron deficiency anemia, unspecified: Secondary | ICD-10-CM | POA: Diagnosis not present

## 2024-05-26 NOTE — Patient Instructions (Signed)
 Below you will find the link to the [facility] community research project I mentioned through [employer]. This is a free genetic screening opportunity specifically looking for three conditions: 1. Hereditary breast and ovarian cancer syndrome 2. Lynch syndrome (increased risks for colorectal, endometrial and other cancers) 3. Familial hypercholesterolemia (very high cholesterol) We focus on these three conditions because they can occur in the general population, and if you discover you have one of these conditions, there are specific actions you can take to reduce your risk. If you'd like to learn more, I recommend visiting this link: [email or web address]

## 2024-05-26 NOTE — Assessment & Plan Note (Signed)
 Currently not on any method for prevention. May start Micronor  but hoping for husband to get a vasectomy in the near future

## 2024-05-26 NOTE — Progress Notes (Signed)
 Date:  05/26/2024   Name:  Renee Hart   DOB:  1990/11/08   MRN:  [id number]   Chief Complaint: Annual Exam Renee Hart is a 33 y.o. female who presents today for her Complete Annual Exam. She feels well. She reports exercising on her pelaton bike . She reports she is sleeping poorly. Breast complaints none.  She is 5 months post partum and doing well. Breast feeding but planning to taper off.  Health Maintenance  Topic Date Due   COVID-19 Vaccine (4 - 2024-25 season) 06/11/2024*   Flu Shot  01/13/2025*   Hepatitis B Vaccine (1 of 3 - 19+ 3-dose series) 05/26/2025*   HPV Vaccine (1 - Risk 3-dose SCDM series) 05/26/2025*   Pap with HPV screening  12/29/2026   DTaP/Tdap/Td vaccine (3 - Td or Tdap) 10/02/2033   Hepatitis C Screening  Completed   HIV Screening  Completed   Meningitis B Vaccine  Aged Out  *Topic was postponed. The date shown is not the original due date.    Anemia Presents for follow-up visit. There has been no abdominal pain, light-headedness or palpitations. Signs of blood loss that are not present include menorrhagia.    Review of Systems  Constitutional:  Negative for fatigue and unexpected weight change.  HENT:  Negative for trouble swallowing.   Eyes:  Negative for visual disturbance.  Respiratory:  Negative for cough, chest tightness, shortness of breath and wheezing.   Cardiovascular:  Negative for chest pain, palpitations and leg swelling.  Gastrointestinal:  Negative for abdominal pain, constipation and diarrhea.  Genitourinary:  Negative for menorrhagia, menstrual problem and urgency.  Musculoskeletal:  Negative for arthralgias and myalgias.  Skin:  Positive for color change (recent moles removed - benign).  Neurological:  Negative for dizziness, weakness, light-headedness and headaches.  Psychiatric/Behavioral:  Negative for sleep disturbance. The patient is not nervous/anxious.      Lab Results  Component Value Date   NA 134 05/24/2023    K 4.0 05/24/2023   CO2 21 05/24/2023   GLUCOSE 91 05/24/2023   BUN 9 05/24/2023   CREATININE 0.71 05/24/2023   CALCIUM  9.2 05/24/2023   EGFR 115 05/24/2023   GFRNONAA 101 03/17/2020   Lab Results  Component Value Date   CHOL 180 05/24/2023   HDL 78 05/24/2023   LDLCALC 88 05/24/2023   TRIG 74 05/24/2023   CHOLHDL 2.3 05/24/2023   Lab Results  Component Value Date   TSH 1.450 05/24/2023   Lab Results  Component Value Date   HGBA1C 5.3 05/24/2023   Lab Results  Component Value Date   WBC 10.3 12/27/2023   HGB 9.4 (L) 12/27/2023   HCT 28.5 (L) 12/27/2023   MCV 95.3 12/27/2023   PLT 127 (L) 12/27/2023   Lab Results  Component Value Date   ALT 10 05/24/2023   AST 16 05/24/2023   ALKPHOS 71 05/24/2023   BILITOT 0.3 05/24/2023   No results found for: MARIEN BOLLS, VD25OH   Patient Active Problem List   Diagnosis Date Noted   History of cesarean delivery affecting pregnancy 10/03/2023   Family planning advice 05/01/2023   Old bucket handle tear of lateral meniscus of right knee 11/24/2022   Anxiety disorder 11/24/2022    No Known Allergies  Past Surgical History:  Procedure Laterality Date   CESAREAN SECTION  05/07/2021   Procedure: CESAREAN SECTION;  Surgeon: Connell Davies, MD;  Location: [facility] ORS;  Service: Obstetrics;;   EYE SURGERY  PRK   KNEE ARTHROSCOPY WITH MEDIAL MENISECTOMY Right 08/28/2018   Procedure: KNEE ARTHROSCOPY WITH MEDIAL OR LATERAL MENISECTOMY;  Surgeon: Marchia Drivers, MD;  Location: [facility] ORS;  Service: Orthopedics;  Laterality: Right;   REFRACTIVE SURGERY      Social History   Tobacco Use   Smoking status: Never   Smokeless tobacco: Never  Vaping Use   Vaping status: Never Used  Substance Use Topics   Alcohol use: Not Currently    Alcohol/week: 2.0 standard drinks of alcohol    Types: 2 Glasses of wine per week    Comment: socially   Drug use: Never     Medication list has been reviewed and  updated.  No outpatient medications have been marked as taking for the 05/26/24 encounter (Office Visit) with Justus Leita DEL, MD.       05/26/2024    8:16 AM 06/15/2023   12:04 PM 05/24/2023   10:32 AM 11/24/2022    2:21 PM  GAD 7 : Generalized Anxiety Score  Nervous, Anxious, on Edge 0 0 0 1  Control/stop worrying 0 0 0 0  Worry too much - different things 0 0 0 1  Trouble relaxing 0 0 0 0  Restless 0 0 0 0  Easily annoyed or irritable 0 0 0 1  Afraid - awful might happen 0 0 0 0  Total GAD 7 Score 0 0 0 3  Anxiety Difficulty Not difficult at all  Not difficult at all Not difficult at all       05/26/2024    8:16 AM 06/15/2023   12:04 PM 05/24/2023   10:32 AM  Depression screen PHQ 2/9  Decreased Interest 0 0 0  Down, Depressed, Hopeless 0 0 0  PHQ - 2 Score 0 0 0  Altered sleeping 2 1 0  Tired, decreased energy 0 1 1  Change in appetite 0 1 0  Feeling bad or failure about yourself  0 0 0  Trouble concentrating 0 0 0  Moving slowly or fidgety/restless 0 0 0  Suicidal thoughts 0 0 0  PHQ-9 Score 2 3 1   Difficult doing work/chores Not difficult at all Not difficult at all Not difficult at all    BP Readings from Last 3 Encounters:  05/26/24 90/60  02/06/24 110/77  12/26/23 108/77    Physical Exam Vitals and nursing note reviewed.  Constitutional:      General: She is not in acute distress.    Appearance: She is well-developed.  HENT:     Head: Normocephalic and atraumatic.     Right Ear: Tympanic membrane and ear canal normal.     Left Ear: Tympanic membrane and ear canal normal.     Nose:     Right Sinus: No maxillary sinus tenderness.     Left Sinus: No maxillary sinus tenderness.  Eyes:     General: No scleral icterus.       Right eye: No discharge.        Left eye: No discharge.     Conjunctiva/sclera: Conjunctivae normal.  Neck:     Thyroid : No thyromegaly.     Vascular: No carotid bruit.  Cardiovascular:     Rate and Rhythm: Normal rate and regular  rhythm.     Pulses: Normal pulses.     Heart sounds: Normal heart sounds.  Pulmonary:     Effort: Pulmonary effort is normal. No respiratory distress.     Breath sounds: No wheezing.  Abdominal:  General: Bowel sounds are normal.     Palpations: Abdomen is soft.     Tenderness: There is no abdominal tenderness.  Musculoskeletal:     Cervical back: Normal range of motion. No erythema.     Right lower leg: No edema.     Left lower leg: No edema.  Lymphadenopathy:     Cervical: No cervical adenopathy.  Skin:    General: Skin is warm and dry.     Capillary Refill: Capillary refill takes less than 2 seconds.     Findings: No rash.  Neurological:     Mental Status: She is alert and oriented to person, place, and time.     Cranial Nerves: No cranial nerve deficit.     Sensory: No sensory deficit.     Deep Tendon Reflexes: Reflexes are normal and symmetric.  Psychiatric:        Attention and Perception: Attention normal.        Mood and Affect: Mood normal.     Wt Readings from Last 3 Encounters:  05/26/24 129 lb 12.8 oz (58.9 kg)  02/06/24 150 lb 1.6 oz (68.1 kg)  12/26/23 172 lb (78 kg)    BP 90/60   Pulse 88   Ht 5' 4 (1.626 m)   Wt 129 lb 12.8 oz (58.9 kg)   SpO2 99%   Breastfeeding Yes   BMI 22.28 kg/m   Assessment and Plan:  Problem List Items Addressed This Visit       Unprioritized   Family planning advice   Currently not on any method for prevention. May start Micronor  but hoping for husband to get a vasectomy in the near future      Other Visit Diagnoses       Annual physical exam    -  Primary   Relevant Orders   CBC with Differential/Platelet   Comprehensive metabolic panel with GFR   Hemoglobin A1c   Lipid panel   TSH     Screening for lipid disorders       Relevant Orders   Lipid panel     Screening for diabetes mellitus       Relevant Orders   Hemoglobin A1c     Iron deficiency anemia, unspecified iron deficiency anemia type        Relevant Orders   CBC with Differential/Platelet       Return in about 1 year (around 05/26/2025) for CPX Dr Lemon  (Wed or Fri best).    Leita HILARIO Adie, MD [facility] [place]

## 2024-05-27 ENCOUNTER — Ambulatory Visit: Payer: Self-pay | Admitting: Internal Medicine

## 2024-05-27 LAB — COMPREHENSIVE METABOLIC PANEL WITH GFR
ALT: 12 IU/L (ref 0–32)
AST: 18 IU/L (ref 0–40)
Albumin: 4.7 g/dL (ref 3.9–4.9)
Alkaline Phosphatase: 109 IU/L (ref 44–121)
BUN/Creatinine Ratio: 16 (ref 9–23)
BUN: 15 mg/dL (ref 6–20)
Bilirubin Total: 0.7 mg/dL (ref 0.0–1.2)
CO2: 21 mmol/L (ref 20–29)
Calcium: 9.4 mg/dL (ref 8.7–10.2)
Chloride: 104 mmol/L (ref 96–106)
Creatinine, Ser: 0.94 mg/dL (ref 0.57–1.00)
Globulin, Total: 2.3 g/dL (ref 1.5–4.5)
Glucose: 90 mg/dL (ref 70–99)
Potassium: 4.1 mmol/L (ref 3.5–5.2)
Sodium: 141 mmol/L (ref 134–144)
Total Protein: 7 g/dL (ref 6.0–8.5)
eGFR: 82 mL/min/1.73 (ref >59)

## 2024-05-27 LAB — CBC WITH DIFFERENTIAL/PLATELET
Basophils Absolute: 0 x10E3/uL (ref 0.0–0.2)
Basos: 0 %
EOS (ABSOLUTE): 0.1 x10E3/uL (ref 0.0–0.4)
Eos: 1 %
Hematocrit: 42.8 % (ref 34.0–46.6)
Hemoglobin: 13.9 g/dL (ref 11.1–15.9)
Immature Grans (Abs): 0 x10E3/uL (ref 0.0–0.1)
Immature Granulocytes: 0 %
Lymphocytes Absolute: 0.9 x10E3/uL (ref 0.7–3.1)
Lymphs: 13 %
MCH: 31.2 pg (ref 26.6–33.0)
MCHC: 32.5 g/dL (ref 31.5–35.7)
MCV: 96 fL (ref 79–97)
Monocytes Absolute: 0.5 x10E3/uL (ref 0.1–0.9)
Monocytes: 7 %
Neutrophils Absolute: 5.4 x10E3/uL (ref 1.4–7.0)
Neutrophils: 79 %
Platelets: 214 x10E3/uL (ref 150–450)
RBC: 4.45 x10E6/uL (ref 3.77–5.28)
RDW: 12 % (ref 11.7–15.4)
WBC: 6.9 x10E3/uL (ref 3.4–10.8)

## 2024-05-27 LAB — TSH: TSH: 2.38 u[IU]/mL (ref 0.450–4.500)

## 2024-05-27 LAB — HEMOGLOBIN A1C
Est. average glucose Bld gHb Est-mCnc: 100 mg/dL
Hgb A1c MFr Bld: 5.1 % (ref 4.8–5.6)

## 2024-05-27 LAB — LIPID PANEL
Chol/HDL Ratio: 2.9 ratio (ref 0.0–4.4)
Cholesterol, Total: 233 mg/dL — ABNORMAL HIGH (ref 100–199)
HDL: 79 mg/dL (ref >39)
LDL Chol Calc (NIH): 139 mg/dL — ABNORMAL HIGH (ref 0–99)
Triglycerides: 85 mg/dL (ref 0–149)
VLDL Cholesterol Cal: 15 mg/dL (ref 5–40)

## 2024-06-17 ENCOUNTER — Encounter: Payer: Self-pay | Admitting: Dermatology

## 2024-06-22 ENCOUNTER — Encounter: Payer: Self-pay | Admitting: Licensed Practical Nurse

## 2024-06-25 ENCOUNTER — Ambulatory Visit

## 2024-06-25 ENCOUNTER — Ambulatory Visit (INDEPENDENT_AMBULATORY_CARE_PROVIDER_SITE_OTHER)

## 2024-06-25 ENCOUNTER — Other Ambulatory Visit: Payer: Self-pay

## 2024-06-25 DIAGNOSIS — D485 Neoplasm of uncertain behavior of skin: Secondary | ICD-10-CM

## 2024-06-25 DIAGNOSIS — D225 Melanocytic nevi of trunk: Secondary | ICD-10-CM | POA: Diagnosis not present

## 2024-06-25 DIAGNOSIS — L729 Follicular cyst of the skin and subcutaneous tissue, unspecified: Secondary | ICD-10-CM

## 2024-06-25 DIAGNOSIS — D492 Neoplasm of unspecified behavior of bone, soft tissue, and skin: Secondary | ICD-10-CM

## 2024-06-25 MED ORDER — CLINDAMYCIN PHOSPHATE 1 % EX SWAB
1.0000 | Freq: Two times a day (BID) | CUTANEOUS | 1 refills | Status: AC
  Filled 2024-06-25: qty 60, 30d supply, fill #0
  Filled 2024-08-30: qty 60, 30d supply, fill #1

## 2024-06-25 NOTE — Patient Instructions (Addendum)

## 2024-06-25 NOTE — Progress Notes (Signed)
 Subjective   Renee Hart is a 33 y.o. female who presents for the following: Lesion(s) of concern . Patient is established patient   Today patient reports: Patient reports bump at bottom. Patient was chasing her son down slide and fell on bottom 05/07/24, patient reports it has popped and has been applying mighty patches on it and drains a lot of oil, but reoccurs. Patient reports it does hurt to sit on it. Patient also wanting place looked at suprapubic area. Hx dysplastic nevus.   Review of Systems:    No other skin or systemic complaints except as noted in HPI or Assessment and Plan.  The following portions of the chart were reviewed this encounter and updated as appropriate: medications, allergies, medical history  Relevant Medical History:  Personal history of dysplastic nevus.   Objective  Well appearing patient in no apparent distress; mood and affect are within normal limits. Examination was performed of the: Focused Exam of: Buttocks, central mons pubis   Examination notable for: Nevus/nevi: Scattered well-demarcated, regular, pigmented macule(s) and/or papule(s)    -Left buttock with faint brown plaque with underlying subcutaneous nodule   Central mons pubis 5 mm pink papule    Assessment & Plan   Melanocytic nevi - Benign appearing on today's exam, patient reassured - Continue active observation - Pt instructed on A,B,C, D, and Es of an evolving melanoma - Pt instructed to contact clinic if any of these worrisome changes arise - Reinforced importance of photoprotective strategies including liberal and frequent sunscreen use of a broad-spectrum SPF 30 or greater, use of protective clothing, and sun avoidance for prevention of cutaneous malignancy and photoaging.  Counseled patient on the importance of regular self-skin monitoring as well as routine clinical skin examinations as scheduled.   Subcutaneous cyst of L buttock, favor epidermal inclusion cyst Chronic  and persistent condition. Condition is symptomatic and bothersome to patient. Patient is flaring and not currently at treatment goal.  - Explained to patient this most likely is consistent with an epidermal inclusion cyst, which represents trapped hair follicule and skin cells under the skin. Discussed often recurrent/chronic and can intermittently flare  -Discussed options including surgical excision, punch removal, monitoring, doxy vs ilk vs topical clinda for flares - Start topical clindamycin  swabs 1% once daily to active areas  -Patient will send us  message through MyChart if flares and wants further tx    Procedures, orders, diagnosis for this visit:   NEOPLASM OF SKIN Central mons pubis Skin / nail biopsy Type of biopsy: tangential   Informed consent: discussed and consent obtained   Timeout: patient name, date of birth, surgical site, and procedure verified   Procedure prep:  Patient was prepped and draped in usual sterile fashion Prep type:  Isopropyl alcohol Anesthesia: the lesion was anesthetized in a standard fashion   Anesthetic:  1% lidocaine  w/ epinephrine  1-100,000 buffered w/ 8.4% NaHCO3 Instrument used: DermaBlade   Hemostasis achieved with: pressure and aluminum chloride   Outcome: patient tolerated procedure well   Post-procedure details: sterile dressing applied and wound care instructions given   Dressing type: bandage and petrolatum    Specimen 1 - Surgical pathology Differential Diagnosis: nevus vs fibrous papule vs folliculitis   Check Margins: No 5 mm pink papule  Neoplasm of skin -     Skin / nail biopsy -     Surgical pathology; Standing  Other orders -     Clindamycin  Phosphate; Apply 1 Application topically 2 (two) times  daily. Apply to the affected area of skin once daily  Dispense: 60 each; Refill: 1    Return to clinic: Return for As scheduled, w/ Dr. Jackquline.  Documentation: I have reviewed the above documentation for accuracy and  completeness, and I agree with the above.  Lauraine JAYSON Kanaris, MD

## 2024-06-26 LAB — SURGICAL PATHOLOGY

## 2024-06-30 ENCOUNTER — Ambulatory Visit: Payer: Self-pay

## 2024-06-30 NOTE — Telephone Encounter (Signed)
 Left voicemail to return my call

## 2024-06-30 NOTE — Telephone Encounter (Signed)
-----   Message from Lauraine JAYSON Kanaris sent at 06/30/2024  8:55 AM EDT -----  1. Skin, central mons pubis :       MELANOCYTIC NEVUS, INTRADERMAL TYPE   Please notify patient with below plan: Benign, observe.   ----- Message ----- From: Interface, Lab In [place] Sent: 06/26/2024   6:41 PM EDT To: Lauraine JAYSON Kanaris, MD

## 2024-07-02 NOTE — Telephone Encounter (Signed)
-----   Message from Lauraine JAYSON Kanaris sent at 06/30/2024  8:55 AM EDT -----  1. Skin, central mons pubis :       MELANOCYTIC NEVUS, INTRADERMAL TYPE   Please notify patient with below plan: Benign, observe.   ----- Message ----- From: Interface, Lab In [place] Sent: 06/26/2024   6:41 PM EDT To: Lauraine JAYSON Kanaris, MD

## 2024-07-02 NOTE — Telephone Encounter (Signed)
 Advised pt of bx results/sh ?

## 2024-08-11 ENCOUNTER — Other Ambulatory Visit: Payer: Self-pay

## 2024-08-11 MED ORDER — TRETINOIN 0.025 % EX CREA
TOPICAL_CREAM | Freq: Every day | CUTANEOUS | 5 refills | Status: AC
  Filled 2024-08-11: qty 20, 30d supply, fill #0
  Filled 2024-08-30 – 2024-09-04 (×3): qty 20, 30d supply, fill #1

## 2024-08-14 ENCOUNTER — Encounter: Payer: Self-pay | Admitting: Licensed Practical Nurse

## 2024-08-15 ENCOUNTER — Other Ambulatory Visit: Payer: Self-pay

## 2024-08-15 ENCOUNTER — Other Ambulatory Visit: Payer: Self-pay | Admitting: Licensed Practical Nurse

## 2024-08-15 DIAGNOSIS — N898 Other specified noninflammatory disorders of vagina: Secondary | ICD-10-CM

## 2024-08-15 MED ORDER — ESTROGENS CONJUGATED 0.625 MG/GM VA CREA
1.0000 | TOPICAL_CREAM | Freq: Every day | VAGINAL | 0 refills | Status: AC
Start: 2024-08-15 — End: ?
  Filled 2024-08-15: qty 60, 90d supply, fill #0

## 2024-08-15 NOTE — Progress Notes (Signed)
 Pt reporting vaginal dryness, currently breastfeeding, requesting treatment  Mychart message sent and estrogen cream ordered. Jinnie Cookey, CNM  [facility] 08/15/24  11:29 AM

## 2024-08-31 ENCOUNTER — Other Ambulatory Visit: Payer: Self-pay

## 2024-09-01 ENCOUNTER — Other Ambulatory Visit: Payer: Self-pay

## 2024-09-04 ENCOUNTER — Other Ambulatory Visit: Payer: Self-pay

## 2024-10-13 ENCOUNTER — Other Ambulatory Visit: Payer: Self-pay

## 2024-10-13 ENCOUNTER — Ambulatory Visit

## 2024-10-13 DIAGNOSIS — L700 Acne vulgaris: Secondary | ICD-10-CM | POA: Diagnosis not present

## 2024-10-13 MED ORDER — DOXYCYCLINE MONOHYDRATE 100 MG PO CAPS
100.0000 mg | ORAL_CAPSULE | Freq: Two times a day (BID) | ORAL | 3 refills | Status: DC
  Filled 2024-10-13: qty 60, 30d supply, fill #0

## 2024-10-13 MED ORDER — SPIRONOLACTONE 50 MG PO TABS
100.0000 mg | ORAL_TABLET | Freq: Every day | ORAL | 3 refills | Status: AC
  Filled 2024-10-13: qty 60, 30d supply, fill #0

## 2024-10-13 MED ORDER — DOXYCYCLINE MONOHYDRATE 100 MG PO CAPS
100.0000 mg | ORAL_CAPSULE | Freq: Two times a day (BID) | ORAL | 0 refills | Status: AC
  Filled 2024-10-13: qty 28, 14d supply, fill #0

## 2024-10-13 NOTE — Patient Instructions (Addendum)
 Doxycycline  should be taken with food to prevent nausea. Do not lay down for 30 minutes after taking. Be cautious with sun exposure and use good sun protection while on this medication. Pregnant women should not take this medication.    Spironolactone can cause increased urination and cause blood pressure to decrease. Please watch for signs of lightheadedness and be cautious when changing position. It can sometimes cause breast tenderness or an irregular period in premenopausal women. It can also increase potassium. The increase in potassium usually is not a concern unless you are taking other medicines that also increase potassium, so please be sure your doctor knows all of the other medications you are taking. This medication should not be taken by pregnant women.  This medicine should also not be taken together with sulfa drugs like Bactrim (trimethoprim/sulfamethexazole).      Due to recent changes in healthcare laws, you may see results of your pathology and/or laboratory studies on MyChart before the doctors have had a chance to review them. We understand that in some cases there may be results that are confusing or concerning to you. Please understand that not all results are received at the same time and often the doctors may need to interpret multiple results in order to provide you with the best plan of care or course of treatment. Therefore, we ask that you please give us  2 business days to thoroughly review all your results before contacting the office for clarification. Should we see a critical lab result, you will be contacted sooner.   If You Need Anything After Your Visit  If you have any questions or concerns for your doctor, please call our main line at [phone] and press option 4 to reach your doctor's medical assistant. If no one answers, please leave a voicemail as directed and we will return your call as soon as possible. Messages left after 4 pm will be answered the following  business day.   You may also send us  a message via MyChart. We typically respond to MyChart messages within 1-2 business days.  For prescription refills, please ask your pharmacy to contact our office. Our fax number is [phone].  If you have an urgent issue when the clinic is closed that cannot wait until the next business day, you can page your doctor at the number below.    Please note that while we do our best to be available for urgent issues outside of office hours, we are not available 24/7.   If you have an urgent issue and are unable to reach us , you may choose to seek medical care at your doctor's office, retail clinic, urgent care center, or emergency room.  If you have a medical emergency, please immediately call 911 or go to the emergency department.  Pager Numbers  - Dr. Hester: [phone]  - Dr. Jackquline: [phone]  - Dr. Claudene: [phone]   - Dr. Raymund: [phone]  In the event of inclement weather, please call our main line at [phone] for an update on the status of any delays or closures.  Dermatology Medication Tips: Please keep the boxes that topical medications come in in order to help keep track of the instructions about where and how to use these. Pharmacies typically print the medication instructions only on the boxes and not directly on the medication tubes.   If your medication is too expensive, please contact our office at [phone] option 4 or send us  a message through MyChart.   We are unable  to tell what your co-pay for medications will be in advance as this is different depending on your insurance coverage. However, we may be able to find a substitute medication at lower cost or fill out paperwork to get insurance to cover a needed medication.   If a prior authorization is required to get your medication covered by your insurance company, please allow us  1-2 business days to complete this process.  Drug prices often vary depending  on where the prescription is filled and some pharmacies may offer cheaper prices.  The website www.goodrx.com contains coupons for medications through different pharmacies. The prices here do not account for what the cost may be with help from insurance (it may be cheaper with your insurance), but the website can give you the price if you did not use any insurance.  - You can print the associated coupon and take it with your prescription to the pharmacy.  - You may also stop by our office during regular business hours and pick up a GoodRx coupon card.  - If you need your prescription sent electronically to a different pharmacy, notify our office through [facility] or by phone at [phone] option 4.     Si Usted Necesita Algo Despus de Su Visita  Tambin puede enviarnos un mensaje a travs de Clinical Cytogeneticist. Por lo general respondemos a los mensajes de MyChart en el transcurso de 1 a 2 das hbiles.  Para renovar recetas, por favor pida a su farmacia que se ponga en contacto con nuestra oficina. Randi lakes de fax es [place] [phone].  Si tiene un asunto urgente cuando la clnica est cerrada y que no puede esperar hasta el siguiente da hbil, puede llamar/localizar a su doctor(a) al nmero que aparece a continuacin.   Por favor, tenga en cuenta que aunque hacemos todo lo posible para estar disponibles para asuntos urgentes fuera del horario de [place], no estamos disponibles las 24 horas del da, los 7 [address]  [phone] de la [place].   Si tiene un problema urgente y no puede comunicarse con nosotros, puede optar por buscar atencin mdica  en el consultorio de su doctor(a), en una clnica privada, en un centro de atencin urgente o en una sala de emergencias.  Si tiene engineer, drilling, por favor llame inmediatamente al 911 o vaya a la sala de emergencias.  Nmeros de bper  - Dr. Hester: [phone]  - Dra. Jackquline: [phone]  - Dr. Claudene: [phone]  - Dra. Kitts:  [phone]  En caso de inclemencias del [place], por favor llame a nuestra lnea principal al [phone] para una actualizacin sobre el estado de cualquier retraso o cierre.  Consejos para la medicacin en dermatologa: Por favor, guarde las cajas en las que vienen los medicamentos de uso tpico para ayudarle a seguir las instrucciones sobre dnde y cmo usarlos. Las farmacias generalmente imprimen las instrucciones del medicamento slo en las cajas y no directamente en los tubos del [place].   Si su medicamento es muy caro, por favor, pngase en contacto con landry rieger llamando al [phone] y presione la opcin 4 o envenos un mensaje a travs de Clinical Cytogeneticist.   No podemos decirle cul ser su copago por los medicamentos por adelantado ya que esto es diferente dependiendo de la cobertura de su seguro. Sin embargo, es posible que podamos encontrar un medicamento sustituto a audiological scientist un formulario para que el seguro cubra el medicamento que se considera necesario.   Si se requiere una autorizacin previa para  que su compaa de seguros cubra su medicamento, por favor permtanos de 1 a 2 das hbiles para completar [address]  Los precios de los medicamentos varan con frecuencia dependiendo del environmental consultant de dnde se surte la receta y alguna farmacias pueden ofrecer precios ms baratos.  El sitio web www.goodrx.com tiene cupones para medicamentos de health and safety inspector. Los precios aqu no tienen en cuenta lo que podra costar con la ayuda del seguro (puede ser ms barato con su seguro), pero el sitio web puede darle el precio si no utiliz tourist information centre manager.  - Puede imprimir el cupn correspondiente y llevarlo con su receta a la farmacia.  - Tambin puede pasar por nuestra oficina durante el horario de atencin regular y education officer, museum una tarjeta de cupones de GoodRx.  - Si necesita que su receta se enve electrnicamente a una farmacia diferente, informe a nuestra oficina a travs de  MyChart de [place] o por telfono llamando al [phone] y presione la opcin 4.

## 2024-10-13 NOTE — Progress Notes (Signed)
 "   Subjective   Renee Hart is a 33 y.o. female who presents for the following: Lesion(s) of concern . Patient is established patient   Today patient reports: Patient here concerning a bump at left chin and right lip area she states had for over a month. Would like to discuss treatment   Had a history of acne and used spironolactone in the past. Has not currently been using any treatment.   Review of Systems:    No other skin or systemic complaints except as noted in HPI or Assessment and Plan.  The following portions of the chart were reviewed this encounter and updated as appropriate: medications, allergies, medical history  Relevant Medical History:  Reviewed   Objective  (SKPE) Well appearing patient in no apparent distress; mood and affect are within normal limits. Examination was performed of the: Focused Exam of: face    Examination notable for: inflamed papules on chin and cheeks      Assessment & Plan  (SKAP)   Acne vulgaris - inflammatory and hormonal - Chronic and persistent condition with duration or expected duration over one year. Condition is symptomatic and bothersome to patient. Patient is flaring and not currently at treatment goal.  - Discussed various treatment options with patient, as well as need for consistent use for at least 6-12 weeks for full efficacy.  - Reviewed treatment options, including side effects of topical agents, oral antibiotics, OCPs (if female), oral spironolactone (if female), and isotretinoin. Discussed that isotretinoin is the most effective  - After discussion opted to initiate:  - Start doxycycline  100 take 1 po bid with food and drink for 2 weeks  - Discussed side effects and precautions with doxycycline  including taking with meal, waiting at least 30 minutes before lying down at night, increased sun sensitivity, and to stop medication if becomes pregnant or breastfeeding - Start spironolactone 100 mg PO daily (max dose: 100mg   BID, can start lower if concern for side effects) - Discussed risk of benefits of this medication as well as common side effects. Explained that the most common side effect is polyuria which is why we recommend she take the medication in the morning. Other side effects include dizziness, breast tenderness/enlargement (which usually resolves after 1 month), and headaches. Also informed the patient that there is a chance this medication could increase her potassium and we will require occasional blood tests.  - Discussed pregnancy category X and patient should discontinue the medication if trying to conceive or if becomes pregnant. - Screened for renal and cardiac dz and personal and family history of breast cancer - No need to screen for underlying hyperkalemia except if >33 years old, h/o renal or cardiac disease, underlying hepatic function, high doses (>200mg /day), or on ACEi's/NSAIDs/Bactrim - After the full discussion patient opted to proceed to treat her acne with spironolactone. Denies plans for future pregnancies  Most recent cmp reviewed wnl   BP 98/65  P74  Was sun protection counseling provided?: No   Chronic and persistent condition with duration or expected duration over one year. Condition is symptomatic and bothersome to patient. Patient is flaring and not currently at treatment goal.   Patient instructions (SKPI)   Procedures, orders, diagnosis for this visit:  ACNE VULGARIS   This Visit - spironolactone (ALDACTONE) 50 MG tablet - Take 2 tablets (100 mg total) by mouth daily for acne. - doxycycline  (MONODOX ) 100 MG capsule - Take 1 capsule (100 mg total) by mouth 2 (two) times  daily for 14 days.  Acne vulgaris -     Spironolactone; Take 2 tablets (100 mg total) by mouth daily for acne.  Dispense: 60 tablet; Refill: 3 -     Doxycycline  Monohydrate; Take 1 capsule (100 mg total) by mouth 2 (two) times daily for 14 days.  Dispense: 28 capsule; Refill: 0    Return to  clinic: Return if symptoms worsen or fail to improve.  I, Eleanor Blush, CMA, am acting as scribe for Lauraine JAYSON Kanaris, MD.   Documentation: I have reviewed the above documentation for accuracy and completeness, and I agree with the above.  Lauraine JAYSON Kanaris, MD  "

## 2024-10-14 ENCOUNTER — Encounter: Payer: Self-pay | Admitting: Licensed Practical Nurse

## 2024-10-14 ENCOUNTER — Ambulatory Visit

## 2025-03-02 ENCOUNTER — Encounter: Admitting: Dermatology

## 2025-05-27 ENCOUNTER — Encounter: Admitting: Student
